# Patient Record
Sex: Female | Born: 1995 | Hispanic: No | Marital: Single | Smoking: Never smoker
Health system: Southern US, Community
[De-identification: ages and names within clinical notes are randomized; demographics above are authoritative.]

## PROBLEM LIST (undated history)

## (undated) DIAGNOSIS — E282 Polycystic ovarian syndrome: Secondary | ICD-10-CM

---

## 2016-10-17 ENCOUNTER — Encounter (INDEPENDENT_AMBULATORY_CARE_PROVIDER_SITE_OTHER): Payer: Self-pay | Admitting: Physician Assistant

## 2016-10-17 ENCOUNTER — Ambulatory Visit (INDEPENDENT_AMBULATORY_CARE_PROVIDER_SITE_OTHER): Payer: Self-pay | Admitting: Physician Assistant

## 2016-10-17 VITALS — BP 144/87 | HR 97 | Temp 98.2°F | Ht 62.21 in | Wt 156.0 lb

## 2016-10-17 DIAGNOSIS — M545 Low back pain, unspecified: Secondary | ICD-10-CM

## 2016-10-17 DIAGNOSIS — G4700 Insomnia, unspecified: Secondary | ICD-10-CM

## 2016-10-17 DIAGNOSIS — Z23 Encounter for immunization: Secondary | ICD-10-CM

## 2016-10-17 DIAGNOSIS — R5383 Other fatigue: Secondary | ICD-10-CM

## 2016-10-17 LAB — POCT URINALYSIS DIPSTICK
BILIRUBIN UA: NEGATIVE
Glucose, UA: NEGATIVE
NITRITE UA: NEGATIVE
PH UA: 6.5 (ref 5.0–8.0)
PROTEIN UA: 30
Spec Grav, UA: 1.025 (ref 1.010–1.025)
Urobilinogen, UA: 1 E.U./dL

## 2016-10-17 LAB — POCT URINE PREGNANCY: Preg Test, Ur: NEGATIVE

## 2016-10-17 MED ORDER — MELATONIN 5 MG PO TABS: ORAL_TABLET | ORAL | 0 refills | Status: DC

## 2016-10-17 NOTE — Patient Instructions (Signed)

## 2016-10-17 NOTE — Progress Notes (Signed)
`   Subjective:  Patient ID: Cheryl Riddle, female    DOB: 04/20/1995  Age: 21 y.o. MRN: [id number]  CC: insomnia  HPI Cheryl Riddle is a 21 y.o. female with no PMH presents with insomnia since approximately 2-3 months ago. Has been using melatonin, benadryl, and night time cough syrups. Difficulty in maintaining sleep. Takes 3mg  melatonin half hour before bedtime. Can not attribute insomnia to anything in particular but sometimes feels stressed with having to provide for herself without help from anyone.    Also complains of left sided LBP x2 days but no pain currently. Pain described as intense and sharp. Felt as if "spine was on fire". Does not have any urinary symptoms, fever, chills, nausea, vomiting.   ROS Review of Systems  Constitutional: Negative for chills, fever and malaise/fatigue.  Eyes: Negative for blurred vision.  Respiratory: Negative for shortness of breath.   Cardiovascular: Negative for chest pain and palpitations.  Gastrointestinal: Negative for abdominal pain and nausea.  Genitourinary: Negative for dysuria and hematuria.  Musculoskeletal: Negative for joint pain and myalgias.  Skin: Negative for rash.  Neurological: Negative for tingling and headaches.  Psychiatric/Behavioral: Negative for depression. The patient is not nervous/anxious.     Objective:  BP (!) 144/87 (BP Location: Left Arm, Patient Position: Sitting, Cuff Size: Large)   Pulse 97   Temp 98.2 F (36.8 C) (Oral)   Ht 5' 2.21" (1.58 m)   Wt 156 lb (70.8 kg)   LMP 09/26/2016 (Approximate)   SpO2 99%   BMI 28.35 kg/m   BP/Weight 10/17/2016  Systolic BP 144  Diastolic BP 87  Wt. (Lbs) 156  BMI 28.35      Physical Exam  Constitutional: She is oriented to person, place, and time.  Well developed, well nourished, NAD, polite  HENT:  Head: Normocephalic and atraumatic.  Eyes: No scleral icterus.  Neck: Normal range of motion. Neck supple. No thyromegaly present.  Cardiovascular: Normal  rate, regular rhythm and normal heart sounds.   Pulmonary/Chest: Effort normal and breath sounds normal.  Abdominal: Soft. Bowel sounds are normal. There is no tenderness.  Musculoskeletal: She exhibits no edema.  Neurological: She is alert and oriented to person, place, and time. No cranial nerve deficit. Coordination normal.  Skin: Skin is warm and dry. No rash noted. No erythema. No pallor.  Psychiatric: She has a normal mood and affect. Her behavior is normal. Thought content normal.  Vitals reviewed.    Assessment & Plan:   1. Insomnia, unspecified type - CBC with Differential - Comprehensive metabolic panel - TSH - Begin Melatonin 5 mg daily; 4 hours before desired bedtime.  2. Fatigue, unspecified type - CBC with Differential - Comprehensive metabolic panel - TSH  3. Low back pain without sciatica, unspecified back pain laterality, unspecified chronicity - CBC with Differential - Comprehensive metabolic panel - TSH - Urinalysis Dipstick with trace RBC and WBC - POCT urine pregnancy negative     Follow-up: 2 weeks.  Loletta Specteroger David Gomez PA

## 2016-10-18 LAB — COMPREHENSIVE METABOLIC PANEL
ALBUMIN: 4.8 g/dL (ref 3.5–5.5)
ALT: 10 IU/L (ref 0–32)
AST: 12 IU/L (ref 0–40)
Albumin/Globulin Ratio: 2.1 (ref 1.2–2.2)
Alkaline Phosphatase: 65 IU/L (ref 39–117)
BILIRUBIN TOTAL: 0.3 mg/dL (ref 0.0–1.2)
BUN / CREAT RATIO: 10 (ref 9–23)
BUN: 8 mg/dL (ref 6–20)
CHLORIDE: 103 mmol/L (ref 96–106)
CO2: 24 mmol/L (ref 20–29)
CREATININE: 0.78 mg/dL (ref 0.57–1.00)
Calcium: 9.4 mg/dL (ref 8.7–10.2)
GFR calc non Af Amer: 109 mL/min/{1.73_m2} (ref >59)
GFR, EST AFRICAN AMERICAN: 126 mL/min/{1.73_m2} (ref >59)
GLUCOSE: 85 mg/dL (ref 65–99)
Globulin, Total: 2.3 g/dL (ref 1.5–4.5)
Potassium: 3.9 mmol/L (ref 3.5–5.2)
Sodium: 141 mmol/L (ref 134–144)
TOTAL PROTEIN: 7.1 g/dL (ref 6.0–8.5)

## 2016-10-18 LAB — CBC WITH DIFFERENTIAL/PLATELET
Basophils Absolute: 0 10*3/uL (ref 0.0–0.2)
Basos: 0 %
EOS (ABSOLUTE): 0.1 10*3/uL (ref 0.0–0.4)
Eos: 1 %
HEMOGLOBIN: 12.6 g/dL (ref 11.1–15.9)
Hematocrit: 38.2 % (ref 34.0–46.6)
IMMATURE GRANS (ABS): 0 10*3/uL (ref 0.0–0.1)
IMMATURE GRANULOCYTES: 0 %
Lymphocytes Absolute: 1.7 10*3/uL (ref 0.7–3.1)
Lymphs: 21 %
MCH: 29.4 pg (ref 26.6–33.0)
MCHC: 33 g/dL (ref 31.5–35.7)
MCV: 89 fL (ref 79–97)
MONOCYTES: 7 %
Monocytes Absolute: 0.6 10*3/uL (ref 0.1–0.9)
NEUTROS PCT: 71 %
Neutrophils Absolute: 6 10*3/uL (ref 1.4–7.0)
PLATELETS: 316 10*3/uL (ref 150–379)
RBC: 4.29 x10E6/uL (ref 3.77–5.28)
RDW: 13.9 % (ref 12.3–15.4)
WBC: 8.4 10*3/uL (ref 3.4–10.8)

## 2016-10-18 LAB — TSH: TSH: 0.723 u[IU]/mL (ref 0.450–4.500)

## 2016-10-31 ENCOUNTER — Encounter (INDEPENDENT_AMBULATORY_CARE_PROVIDER_SITE_OTHER): Payer: Self-pay | Admitting: Physician Assistant

## 2016-10-31 ENCOUNTER — Ambulatory Visit (INDEPENDENT_AMBULATORY_CARE_PROVIDER_SITE_OTHER): Payer: Self-pay | Admitting: Physician Assistant

## 2016-10-31 VITALS — BP 132/82 | HR 66 | Temp 97.9°F | Wt 160.4 lb

## 2016-10-31 DIAGNOSIS — R51 Headache: Secondary | ICD-10-CM

## 2016-10-31 DIAGNOSIS — R519 Headache, unspecified: Secondary | ICD-10-CM

## 2016-10-31 DIAGNOSIS — R102 Pelvic and perineal pain: Secondary | ICD-10-CM

## 2016-10-31 DIAGNOSIS — G4700 Insomnia, unspecified: Secondary | ICD-10-CM

## 2016-10-31 LAB — POCT URINE PREGNANCY: Preg Test, Ur: NEGATIVE

## 2016-10-31 MED ORDER — NAPROXEN 500 MG PO TABS: 500.0000 mg | ORAL_TABLET | Freq: Two times a day (BID) | ORAL | 0 refills | Status: DC

## 2016-10-31 MED ORDER — MIRTAZAPINE 15 MG PO TABS: 15.0000 mg | ORAL_TABLET | Freq: Every day | ORAL | 3 refills | Status: DC

## 2016-10-31 NOTE — Progress Notes (Signed)
Subjective:  Patient ID: Cheryl Riddle, female    DOB: 11/17/1995  Age: 21 y.o. MRN: [id number]  CC: insomnia f/u  HPI Cheryl Riddle is a 21 y.o. female with a medical hx of headaches and insomnia presents of f/u for the same. Tried Melatonin 5 mg 3-4 hours before bedtime and noticed better initiation of sleep but still had difficulty maintaining sleep.     Headaches have decreased in frequency since acquiring new prescription eye wear. Headache occurs once every week as opposed to 3 per week.     Has severe pelvic cramping before and during her menstrual period. Had a normal pelvic and transvaginal US in [place] 3 years ago. Had STD testing and was found to have Chlamydia. Took abx for chlamydia and gonorrhea. Is sexually active but does not feel cramping is anything compared to when she had chlamydia. Does not believe she is pregnant but her menstrual period has not come yet.   Outpatient Medications Prior to Visit  Medication Sig Dispense Refill  . Melatonin 5 MG TABS Take one tablet 4 hours before desired bedtime 30 tablet 0   No facility-administered medications prior to visit.      ROS Review of Systems  Constitutional: Negative for chills, fever and malaise/fatigue.  Eyes: Negative for blurred vision.  Respiratory: Negative for shortness of breath.   Cardiovascular: Negative for chest pain and palpitations.  Gastrointestinal: Negative for abdominal pain and nausea.  Genitourinary: Negative for dysuria and hematuria.       Menstrual cramping.  Musculoskeletal: Negative for joint pain and myalgias.  Skin: Negative for rash.  Neurological: Positive for headaches. Negative for tingling.  Psychiatric/Behavioral: Negative for depression. The patient has insomnia. The patient is not nervous/anxious.     Objective:  BP 132/82 (BP Location: Left Arm, Patient Position: Sitting, Cuff Size: Normal)   Pulse 66   Temp 97.9 F (36.6 C) (Oral)   Wt 160 lb 6.4 oz (72.8 kg)   LMP  10/01/2016 (Exact Date)   SpO2 100%   BMI 29.14 kg/m   BP/Weight 10/31/2016 10/17/2016  Systolic BP 132 144  Diastolic BP 82 87  Wt. (Lbs) 160.4 156  BMI 29.14 28.35      Physical Exam  Constitutional: She is oriented to person, place, and time.  Well developed, well nourished, NAD, polite  HENT:  Head: Normocephalic and atraumatic.  Eyes: No scleral icterus.  Neck: Normal range of motion. Neck supple. No thyromegaly present.  Cardiovascular: Normal rate, regular rhythm and normal heart sounds.   Pulmonary/Chest: Effort normal and breath sounds normal.  Abdominal: Soft. Bowel sounds are normal. There is no tenderness.  Musculoskeletal: She exhibits no edema.  Neurological: She is alert and oriented to person, place, and time.  Skin: Skin is warm and dry. No rash noted. No erythema. No pallor.  Psychiatric: She has a normal mood and affect. Her behavior is normal. Thought content normal.  Vitals reviewed.    Assessment & Plan:     1. Insomnia, unspecified type - Begin mirtazapine (REMERON) 15 MG tablet; Take 1 tablet (15 mg total) by mouth at bedtime.  Dispense: 30 tablet; Refill: 3  2. Nonintractable headache, unspecified chronicity pattern, unspecified headache type - Begin Mirtazapine 15 mg qhs  3. Pelvic cramping - US Pelvis Complete; Future - POCT urine pregnancy - US Transvaginal Non-OB; Future - Begin naproxen (NAPROSYN) 500 MG tablet; Take 1 tablet (500 mg total) by mouth 2 (two) times daily with a meal.  Dispense: 30  tablet; Refill: 0   Meds ordered this encounter  Medications  . mirtazapine (REMERON) 15 MG tablet    Sig: Take 1 tablet (15 mg total) by mouth at bedtime.    Dispense:  30 tablet    Refill:  3    Order Specific Question:   Supervising Provider    Answer:   Quentin Angst [id number]  . naproxen (NAPROSYN) 500 MG tablet    Sig: Take 1 tablet (500 mg total) by mouth 2 (two) times daily with a meal.    Dispense:  30 tablet    Refill:  0     Order Specific Question:   Supervising Provider    Answer:   Quentin Angst [[id number]]    Follow-up: Return in about 8 weeks (around 12/26/2016).   Loletta Specter PA

## 2016-10-31 NOTE — Patient Instructions (Signed)

## 2016-10-31 NOTE — Progress Notes (Signed)
Patient still has the insomnia  States her anxiety levels are down now that her sister has moved in.  Denies back pain   Pt still having headaches but they have gotten a little better since getting her glasses

## 2016-11-07 ENCOUNTER — Ambulatory Visit (HOSPITAL_COMMUNITY): Payer: Self-pay

## 2016-11-26 ENCOUNTER — Ambulatory Visit (HOSPITAL_COMMUNITY)
Admission: RE | Admit: 2016-11-26 | Discharge: 2016-11-26 | Disposition: A | Discharge status: Home / Self Care | Payer: Self-pay | Source: Ambulatory Visit | Attending: Physician Assistant | Admitting: Physician Assistant

## 2016-11-26 DIAGNOSIS — R102 Pelvic and perineal pain: Secondary | ICD-10-CM | POA: Insufficient documentation

## 2016-11-29 ENCOUNTER — Telehealth (INDEPENDENT_AMBULATORY_CARE_PROVIDER_SITE_OTHER): Payer: Self-pay

## 2016-11-29 NOTE — Telephone Encounter (Signed)
Patient verified DOB Patient is aware of trace of fluid being noted next to the right ovary. Patient is aware of monitor for severe pain or bleeding but that the finding is not worrisome and the fluid should reabsorb into the body. Patient has an appointment for 12/23/16 and will follow up accordingly.

## 2016-11-29 NOTE — Telephone Encounter (Signed)
-----   Message from Loletta Specteroger David Gomez, PA-C sent at 11/27/2016  8:35 AM EDT ----- Trace fluid adjacent to right ovary, not worrisome, may have been a ruptured cyst. Otherwise no findings.

## 2016-12-23 ENCOUNTER — Ambulatory Visit (INDEPENDENT_AMBULATORY_CARE_PROVIDER_SITE_OTHER): Payer: Self-pay | Admitting: Physician Assistant

## 2016-12-23 ENCOUNTER — Encounter (INDEPENDENT_AMBULATORY_CARE_PROVIDER_SITE_OTHER): Payer: Self-pay | Admitting: Physician Assistant

## 2016-12-23 VITALS — BP 122/79 | HR 100 | Temp 98.0°F | Wt 163.0 lb

## 2016-12-23 DIAGNOSIS — F411 Generalized anxiety disorder: Secondary | ICD-10-CM

## 2016-12-23 DIAGNOSIS — R51 Headache: Secondary | ICD-10-CM

## 2016-12-23 DIAGNOSIS — R519 Headache, unspecified: Secondary | ICD-10-CM

## 2016-12-23 DIAGNOSIS — N912 Amenorrhea, unspecified: Secondary | ICD-10-CM

## 2016-12-23 LAB — POCT URINE PREGNANCY: PREG TEST UR: NEGATIVE

## 2016-12-23 MED ORDER — ESCITALOPRAM OXALATE 10 MG PO TABS: 10.0000 mg | ORAL_TABLET | Freq: Every day | ORAL | 1 refills | Status: DC

## 2016-12-23 MED ORDER — TOPIRAMATE 25 MG PO TABS: 25.0000 mg | ORAL_TABLET | Freq: Two times a day (BID) | ORAL | 2 refills | Status: DC

## 2016-12-23 NOTE — Patient Instructions (Signed)
Please   Generalized Anxiety Disorder, Adult Generalized anxiety disorder (GAD) is a mental health disorder. People with this condition constantly worry about everyday events. Unlike normal anxiety, worry related to GAD is not triggered by a specific event. These worries also do not fade or get better with time. GAD interferes with life functions, including relationships, work, and school. GAD can vary from mild to severe. People with severe GAD can have intense waves of anxiety with physical symptoms (panic attacks). What are the causes? The exact cause of GAD is not known. What increases the risk? This condition is more likely to develop in:  Women.  People who have a family history of anxiety disorders.  People who are very shy.  People who experience very stressful life events, such as the death of a loved one.  People who have a very stressful family environment.  What are the signs or symptoms? People with GAD often worry excessively about many things in their lives, such as their health and family. They may also be overly concerned about:  Doing well at work.  Being on time.  Natural disasters.  Friendships.  Physical symptoms of GAD include:  Fatigue.  Muscle tension or having muscle twitches.  Trembling or feeling shaky.  Being easily startled.  Feeling like your heart is pounding or racing.  Feeling out of breath or like you cannot take a deep breath.  Having trouble falling asleep or staying asleep.  Sweating.  Nausea, diarrhea, or irritable bowel syndrome (IBS).  Headaches.  Trouble concentrating or remembering facts.  Restlessness.  Irritability.  How is this diagnosed? Your health care provider can diagnose GAD based on your symptoms and medical history. You will also have a physical exam. The health care provider will ask specific questions about your symptoms, including how severe they are, when they started, and if they come and go. Your  health care provider may ask you about your use of alcohol or drugs, including prescription medicines. Your health care provider may refer you to a mental health specialist for further evaluation. Your health care provider will do a thorough examination and may perform additional tests to rule out other possible causes of your symptoms. To be diagnosed with GAD, a person must have anxiety that:  Is out of his or her control.  Affects several different aspects of his or her life, such as work and relationships.  Causes distress that makes him or her unable to take part in normal activities.  Includes at least three physical symptoms of GAD, such as restlessness, fatigue, trouble concentrating, irritability, muscle tension, or sleep problems.  Before your health care provider can confirm a diagnosis of GAD, these symptoms must be present more days than they are not, and they must last for six months or longer. How is this treated? The following therapies are usually used to treat GAD:  Medicine. Antidepressant medicine is usually prescribed for long-term daily control. Antianxiety medicines may be added in severe cases, especially when panic attacks occur.  Talk therapy (psychotherapy). Certain types of talk therapy can be helpful in treating GAD by providing support, education, and guidance. Options include: ? Cognitive behavioral therapy (CBT). People learn coping skills and techniques to ease their anxiety. They learn to identify unrealistic or negative thoughts and behaviors and to replace them with positive ones. ? Acceptance and commitment therapy (ACT). This treatment teaches people how to be mindful as a way to cope with unwanted thoughts and feelings. ? Biofeedback. This process  trains you to manage your body's response (physiological response) through breathing techniques and relaxation methods. You will work with a therapist while machines are used to monitor your physical  symptoms.  Stress management techniques. These include yoga, meditation, and exercise.  A mental health specialist can help determine which treatment is best for you. Some people see improvement with one type of therapy. However, other people require a combination of therapies. Follow these instructions at home:  Take over-the-counter and prescription medicines only as told by your health care provider.  Try to maintain a normal routine.  Try to anticipate stressful situations and allow extra time to manage them.  Practice any stress management or self-calming techniques as taught by your health care provider.  Do not punish yourself for setbacks or for not making progress.  Try to recognize your accomplishments, even if they are small.  Keep all follow-up visits as told by your health care provider. This is important. Contact a health care provider if:  Your symptoms do not get better.  Your symptoms get worse.  You have signs of depression, such as: ? A persistently sad, cranky, or irritable mood. ? Loss of enjoyment in activities that used to bring you joy. ? Change in weight or eating. ? Changes in sleeping habits. ? Avoiding friends or family members. ? Loss of energy for normal tasks. ? Feelings of guilt or worthlessness. Get help right away if:  You have serious thoughts about hurting yourself or others. If you ever feel like you may hurt yourself or others, or have thoughts about taking your own life, get help right away. You can go to your nearest emergency department or call:  Your local emergency services (911 in the [place]  A suicide crisis helpline, such as the National Suicide Prevention Lifeline at 236-831-6528. This is open 24 hours a day.  Summary  Generalized anxiety disorder (GAD) is a mental health disorder that involves worry that is not triggered by a specific event.  People with GAD often worry excessively about many things in their lives, such  as their health and family.  GAD may cause physical symptoms such as restlessness, trouble concentrating, sleep problems, frequent sweating, nausea, diarrhea, headaches, and trembling or muscle twitching.  A mental health specialist can help determine which treatment is best for you. Some people see improvement with one type of therapy. However, other people require a combination of therapies. This information is not intended to replace advice given to you by your health care provider. Make sure you discuss any questions you have with your health care provider. Document Released: 07/06/2012 Document Revised: 01/30/2016 Document Reviewed: 01/30/2016 Elsevier Interactive Patient Education  [employer].

## 2016-12-23 NOTE — Progress Notes (Signed)
Subjective:  Patient ID: Cheryl Riddle, female    DOB: Apr 09, 1995  Age: 21 y.o. MRN: [id number]  CC: insomnia  HPI  Cheryl Riddle is a 21 y.o. female with a medical hx of headaches and insomnia presents of f/u for insomnia. Says she used Mirtazapine 15 mg worked for the first two weeks with sustained sleep and reduction in headaches. She stopped Mirtazapine at week three because she developed headaches again. Took naproxen with some relief of headache. Does not want to take pills for her headache if she can help it.    Also complains of amenorrhea for the past 3 months.  Last depo shot 3-4 years ago. Recent TSH and US pelvic/transvaginal were normal. She is sexually active.    Outpatient Medications Prior to Visit  Medication Sig Dispense Refill  . Melatonin 5 MG TABS Take one tablet 4 hours before desired bedtime (Patient not taking: Reported on 12/23/2016) 30 tablet 0  . mirtazapine (REMERON) 15 MG tablet Take 1 tablet (15 mg total) by mouth at bedtime. (Patient not taking: Reported on 12/23/2016) 30 tablet 3  . naproxen (NAPROSYN) 500 MG tablet Take 1 tablet (500 mg total) by mouth 2 (two) times daily with a meal. (Patient not taking: Reported on 12/23/2016) 30 tablet 0   No facility-administered medications prior to visit.      ROS Review of Systems  Constitutional: Negative for chills, fever and malaise/fatigue.  Eyes: Negative for blurred vision.  Respiratory: Negative for shortness of breath.   Cardiovascular: Negative for chest pain and palpitations.  Gastrointestinal: Negative for abdominal pain and nausea.  Genitourinary: Negative for dysuria and hematuria.  Musculoskeletal: Negative for joint pain and myalgias.  Skin: Negative for rash.  Neurological: Negative for tingling and headaches.  Psychiatric/Behavioral: Negative for depression. The patient is nervous/anxious.     Objective:  BP 122/79 (BP Location: Left Arm, Patient Position: Sitting, Cuff Size: Normal)    Pulse 100   Temp 98 F (36.7 C) (Oral)   Wt 163 lb (73.9 kg)   LMP 10/03/2016 (Approximate)   SpO2 100%   BMI 29.62 kg/m   BP/Weight 12/23/2016 10/31/2016 10/17/2016  Systolic BP 122 132 144  Diastolic BP 79 82 87  Wt. (Lbs) 163 160.4 156  BMI 29.62 29.14 28.35      Physical Exam  Constitutional: She is oriented to person, place, and time.  Well developed, well nourished, NAD, polite  HENT:  Head: Normocephalic and atraumatic.  Eyes: No scleral icterus.  Neck: Normal range of motion. Neck supple. No thyromegaly present.  Cardiovascular: Normal rate, regular rhythm and normal heart sounds.   Pulmonary/Chest: Effort normal.  Musculoskeletal: She exhibits no edema.  Neurological: She is alert and oriented to person, place, and time. No cranial nerve deficit. Coordination normal.  Skin: Skin is warm and dry. No rash noted. No erythema. No pallor.  Psychiatric: Her behavior is normal. Thought content normal.  Somewhat anxious.  Vitals reviewed.    Assessment & Plan:   1. Amenorrhea - POCT urine pregnancy negative in clinic today. Recent TSH and US pelvic/transvaginal normal.  2. Generalized anxiety disorder - GAD 7 score 13.  - Begin escitalopram (LEXAPRO) 10 MG tablet; Take 1 tablet (10 mg total) by mouth daily.  Dispense: 30 tablet; Refill: 1 - Sent message to Jenel Lucks, Clinical Social Worker, to reach out to patient.  3. Nonintractable headache, unspecified chronicity pattern, unspecified headache type - Begin topiramate (TOPAMAX) 25 MG tablet; Take 1 tablet (25 mg total)  by mouth 2 (two) times daily.  Dispense: 30 tablet; Refill: 2   Meds ordered this encounter  Medications  . escitalopram (LEXAPRO) 10 MG tablet    Sig: Take 1 tablet (10 mg total) by mouth daily.    Dispense:  30 tablet    Refill:  1    Order Specific Question:   Supervising Provider    Answer:   Quentin Angst [id number]  . topiramate (TOPAMAX) 25 MG tablet    Sig: Take 1 tablet (25 mg  total) by mouth 2 (two) times daily.    Dispense:  30 tablet    Refill:  2    Order Specific Question:   Supervising Provider    Answer:   Quentin Angst [id number]    Follow-up: Return in about 4 weeks (around 01/20/2017) for Anxiety.   Loletta Specter PA

## 2017-01-01 ENCOUNTER — Telehealth: Payer: Self-pay | Admitting: Licensed Clinical Social Worker

## 2017-01-01 NOTE — Telephone Encounter (Signed)
LCSWA placed call to patient to follow up on behavioral screens. A HIPPA compliant message was left requesting a return call.

## 2017-01-03 ENCOUNTER — Telehealth: Payer: Self-pay | Admitting: Licensed Clinical Social Worker

## 2017-01-03 NOTE — Telephone Encounter (Signed)
LCSWA placed call to patient to follow up on behavioral screens. A HIPPA compliant message was left requesting a return call.  

## 2017-02-05 ENCOUNTER — Other Ambulatory Visit: Payer: Self-pay

## 2017-02-05 ENCOUNTER — Ambulatory Visit (INDEPENDENT_AMBULATORY_CARE_PROVIDER_SITE_OTHER): Payer: Self-pay | Admitting: Physician Assistant

## 2017-02-05 ENCOUNTER — Encounter (INDEPENDENT_AMBULATORY_CARE_PROVIDER_SITE_OTHER): Payer: Self-pay | Admitting: Physician Assistant

## 2017-02-05 VITALS — BP 124/89 | HR 61 | Temp 97.5°F | Wt 165.4 lb

## 2017-02-05 DIAGNOSIS — G4700 Insomnia, unspecified: Secondary | ICD-10-CM

## 2017-02-05 DIAGNOSIS — Z566 Other physical and mental strain related to work: Secondary | ICD-10-CM

## 2017-02-05 DIAGNOSIS — Z30016 Encounter for initial prescription of transdermal patch hormonal contraceptive device: Secondary | ICD-10-CM

## 2017-02-05 LAB — POCT URINE PREGNANCY: Preg Test, Ur: NEGATIVE

## 2017-02-05 MED ORDER — NORELGESTROMIN-ETH ESTRADIOL 150-35 MCG/24HR TD PTWK: 1.0000 | MEDICATED_PATCH | TRANSDERMAL | 12 refills | Status: DC

## 2017-02-05 NOTE — Progress Notes (Signed)
   Subjective:  Patient ID: Cheryl Riddle, female    DOB: 06/23/1995  Age: 21 y.o. MRN: [id number]  CC: contraception  HPI  Cheryl PeerLindsey Turneris a 21 y.o.femalewith a medical hx of headaches and insomnia presents for advise on contraception. Interested in transdermal patch as she has used it before and had better adherence as opposed to daily OCP.  Has amenorrhea for the past 4 months.  Last depo shot 3-4 years ago. Recent TSH and US pelvic/transvaginal were normal. Has not been sexually active since one month ago. Liver function normal 4 months ago, no history of coagulopathies, no smoking history, no breast cancer history.     Reports having stopped Topiramate and Escitalopram because other people were telling her that she is becoming more irritable.    Outpatient Medications Prior to Visit  Medication Sig Dispense Refill  . escitalopram (LEXAPRO) 10 MG tablet Take 1 tablet (10 mg total) by mouth daily. (Patient not taking: Reported on 02/05/2017) 30 tablet 1  . topiramate (TOPAMAX) 25 MG tablet Take 1 tablet (25 mg total) by mouth 2 (two) times daily. (Patient not taking: Reported on 02/05/2017) 30 tablet 2   No facility-administered medications prior to visit.      ROS Review of Systems  Constitutional: Negative for chills, fever and malaise/fatigue.  Eyes: Negative for blurred vision.  Respiratory: Negative for shortness of breath.   Cardiovascular: Negative for chest pain and palpitations.  Gastrointestinal: Negative for abdominal pain and nausea.  Genitourinary: Negative for dysuria and hematuria.       Amenorrhea  Musculoskeletal: Negative for joint pain and myalgias.  Skin: Negative for rash.  Neurological: Negative for tingling and headaches.  Psychiatric/Behavioral: Negative for depression. The patient has insomnia. The patient is not nervous/anxious.        Stress    Objective:  Blood pressure 124/89, pulse 61, temperature (!) 97.5 F (36.4 C), temperature source  Oral, weight 165 lb 6.4 oz (75 kg), last menstrual period 10/01/2016, SpO2 100 %.   Physical Exam  Constitutional: She is oriented to person, place, and time.  Well developed, well nourished, NAD, polite  HENT:  Head: Normocephalic and atraumatic.  Eyes: No scleral icterus.  Neck: Normal range of motion. Neck supple. No thyromegaly present.  Cardiovascular: Normal rate, regular rhythm and normal heart sounds.  Pulmonary/Chest: Effort normal.  Musculoskeletal: She exhibits no edema.  Neurological: She is alert and oriented to person, place, and time. No cranial nerve deficit. Coordination normal.  Skin: Skin is warm and dry. No rash noted. No erythema. No pallor.  Psychiatric: She has a normal mood and affect. Her behavior is normal. Thought content normal.  Vitals reviewed.    Assessment & Plan:    1. Encounter for initial prescription of transdermal patch hormonal contraceptive device - Begin Ortho Evra  - Stop Topiramate  2. Stress at work - Take escitalopram 10 mg as directed. 6-8 weeks are needed for escitalopram to be at maximum effect at this dose.  3. Insomnia - Take escitalopram 10 mg as directed. 6-8 weeks are needed for escitalopram to be at maximum effect at this dose.  Follow-up: 8 weeks for stress and insomnia.   Loletta Specteroger David Jesselle Laflamme PA

## 2017-02-05 NOTE — Patient Instructions (Addendum)
Stop taking Topiramate when on birth control patch regimen.    Ethinyl Estradiol; Norelgestromin skin patches What is this medicine? ETHINYL ESTRADIOL;NORELGESTROMIN (ETH in il es tra DYE ole; nor el JES troe min) skin patch is used as a contraceptive (birth control method). This medicine combines two types of female hormones, an estrogen and a progestin. This patch is used to prevent ovulation and pregnancy. This medicine may be used for other purposes; ask your health care provider or pharmacist if you have questions. COMMON BRAND NAME(S): Ortho Christianne Borrow What should I tell my health care provider before I take this medicine? They need to know if you have or ever had any of these conditions: -abnormal vaginal bleeding -blood vessel disease or blood clots -breast, cervical, endometrial, ovarian, liver, or uterine cancer -diabetes -gallbladder disease -heart disease or recent heart attack -high blood pressure -high cholesterol -kidney disease -liver disease -migraine headaches -stroke -systemic lupus erythematosus (SLE) -tobacco smoker -an unusual or allergic reaction to estrogens, progestins, other medicines, foods, dyes, or preservatives -pregnant or trying to get pregnant -breast-feeding How should I use this medicine? This patch is applied to the skin. Follow the directions on the prescription label. Apply to clean, dry, healthy skin on the buttock, abdomen, upper outer arm or upper torso, in a place where it will not be rubbed by tight clothing. Do not use lotions or other cosmetics on the site where the patch will go. Press the patch firmly in place for 10 seconds to ensure good contact with the skin. Change the patch every 7 days on the same day of the week for 3 weeks. You will then have a break from the patch for 1 week, after which you will apply a new patch. Do not use your medicine more often than directed. Contact your pediatrician regarding the use of this medicine in  children. Special care may be needed. This medicine has been used in female children who have started having menstrual periods. A patient package insert for the product will be given with each prescription and refill. Read this sheet carefully each time. The sheet may change frequently. Overdosage: If you think you have taken too much of this medicine contact a poison control center or emergency room at once. NOTE: This medicine is only for you. Do not share this medicine with others. What if I miss a dose? You will need to replace your patch once a week as directed. If your patch is lost or falls off, contact your health care professional for advice. You may need to use another form of birth control if your patch has been off for more than 1 day. What may interact with this medicine? Do not take this medicine with the following medication: -dasabuvir; ombitasvir; paritaprevir; ritonavir -ombitasvir; paritaprevir; ritonavir This medicine may also interact with the following medications: -acetaminophen -antibiotics or medicines for infections, especially rifampin, rifabutin, rifapentine, and griseofulvin, and possibly penicillins or tetracyclines -aprepitant -ascorbic acid (vitamin C) -atorvastatin -barbiturate medicines, such as phenobarbital -bosentan -carbamazepine -caffeine -clofibrate -cyclosporine -dantrolene -doxercalciferol -felbamate -grapefruit juice -hydrocortisone -medicines for anxiety or sleeping problems, such as diazepam or temazepam -medicines for diabetes, including pioglitazone -modafinil -mycophenolate -nefazodone -oxcarbazepine -phenytoin -prednisolone -ritonavir or other medicines for HIV infection or AIDS -rosuvastatin -selegiline -soy isoflavones supplements -St. John's wort -tamoxifen or raloxifene -theophylline -thyroid hormones -topiramate -warfarin This list may not describe all possible interactions. Give your health care provider a list of all  the medicines, herbs, non-prescription drugs, or dietary supplements you use.  Also tell them if you smoke, drink alcohol, or use illegal drugs. Some items may interact with your medicine. What should I watch for while using this medicine? Visit your doctor or health care professional for regular checks on your progress. You will need a regular breast and pelvic exam and Pap smear while on this medicine. Use an additional method of contraception during the first cycle that you use this patch. If you have any reason to think you are pregnant, stop using this medicine right away and contact your doctor or health care professional. If you are using this medicine for hormone related problems, it may take several cycles of use to see improvement in your condition. Smoking increases the risk of getting a blood clot or having a stroke while you are using hormonal birth control, especially if you are more than 22 years old. You are strongly advised not to smoke. This medicine can make your body retain fluid, making your fingers, hands, or ankles swell. Your blood pressure can go up. Contact your doctor or health care professional if you feel you are retaining fluid. This medicine can make you more sensitive to the sun. Keep out of the sun. If you cannot avoid being in the sun, wear protective clothing and use sunscreen. Do not use sun lamps or tanning beds/booths. If you wear contact lenses and notice visual changes, or if the lenses begin to feel uncomfortable, consult your eye care specialist. In some women, tenderness, swelling, or minor bleeding of the gums may occur. Notify your dentist if this happens. Brushing and flossing your teeth regularly may help limit this. See your dentist regularly and inform your dentist of the medicines you are taking. If you are going to have elective surgery or a MRI, you may need to stop using this medicine before the surgery or MRI. Consult your health care professional for  advice. This medicine does not protect you against HIV infection (AIDS) or any other sexually transmitted diseases. What side effects may I notice from receiving this medicine? Side effects that you should report to your doctor or health care professional as soon as possible: -breast tissue changes or discharge -changes in vaginal bleeding during your period or between your periods -chest pain -coughing up blood -dizziness or fainting spells -headaches or migraines -leg, arm or groin pain -severe or sudden headaches -stomach pain (severe) -sudden shortness of breath -sudden loss of coordination, especially on one side of the body -speech problems -symptoms of vaginal infection like itching, irritation or unusual discharge -tenderness in the upper abdomen -vomiting -weakness or numbness in the arms or legs, especially on one side of the body -yellowing of the eyes or skin Side effects that usually do not require medical attention (report to your doctor or health care professional if they continue or are bothersome): -breakthrough bleeding and spotting that continues beyond the 3 initial cycles of pills -breast tenderness -mood changes, anxiety, depression, frustration, anger, or emotional outbursts -increased sensitivity to sun or ultraviolet light -nausea -skin rash, acne, or brown spots on the skin -weight gain (slight) This list may not describe all possible side effects. Call your doctor for medical advice about side effects. You may report side effects to FDA at 1-800-FDA-1088. Where should I keep my medicine? Keep out of the reach of children. Store at room temperature between 15 and 30 degrees C (59 and 86 degrees F). Keep the patch in its pouch until time of use. Throw away any unused medicine after the  expiration date. Dispose of used patches properly. Since a used patch may still contain active hormones, fold the patch in half so that it sticks to itself prior to disposal.  Throw away in a place where children or pets cannot reach. NOTE: This sheet is a summary. It may not cover all possible information. If you have questions about this medicine, talk to your doctor, pharmacist, or health care provider.  2018 [employer]vier/Gold Standard (2015-11-20 07:59:03)

## 2017-09-04 IMAGING — US US PELVIS COMPLETE
1 series · 14 of 25 positions shown · non-contrast
Comparison: None

CLINICAL DATA: Pelvic cramping for 2-3 years

EXAM:
TRANSABDOMINAL AND TRANSVAGINAL ULTRASOUND OF PELVIS
TECHNIQUE: Both transabdominal and transvaginal ultrasound examinations of the
pelvis were performed. Transabdominal technique was performed for
global imaging of the pelvis including uterus, ovaries, adnexal
regions, and pelvic cul-de-sac. It was necessary to proceed with
endovaginal exam following the transabdominal exam to visualize the
uterus endometrium and ovaries.

[Series 1: us pelvis complete · 0.18mm/px · 14 of 87 slices shown]
[im 1/87]
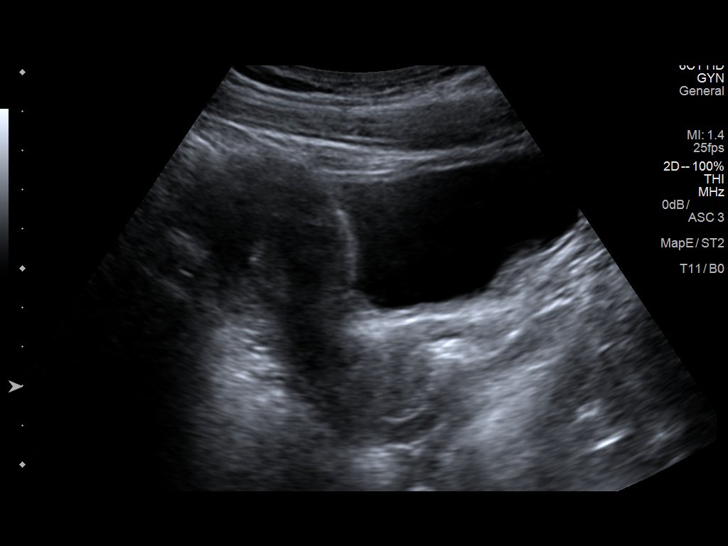
[im 8/87]
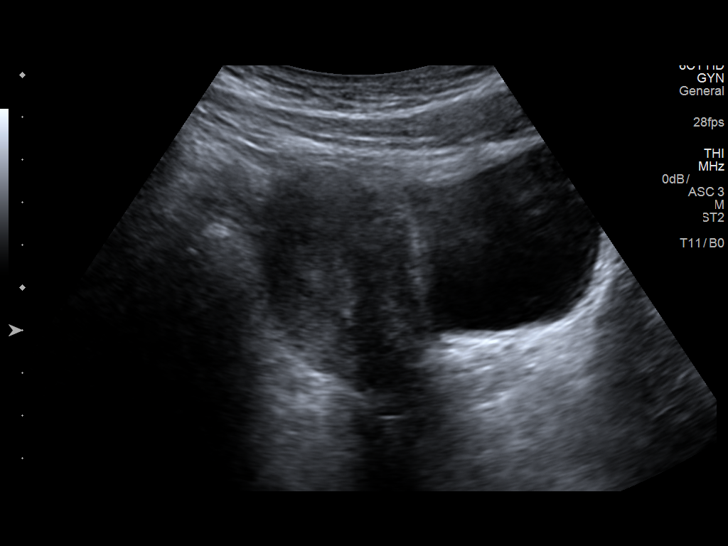
[im 15/87]
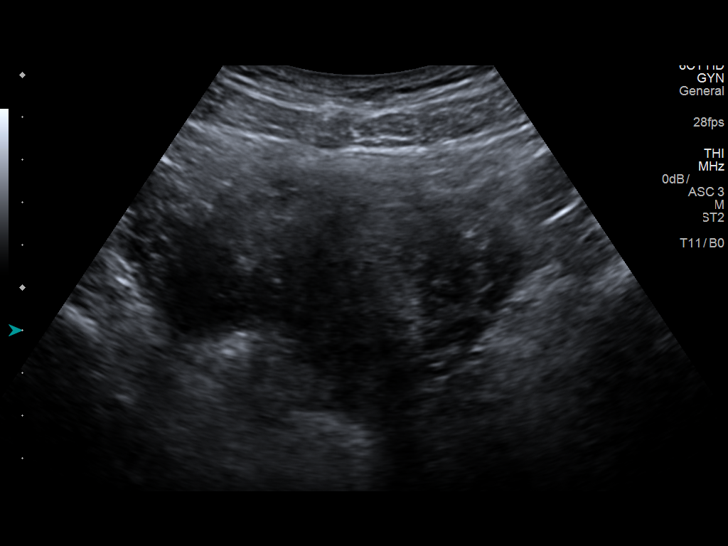
[im 22/87]
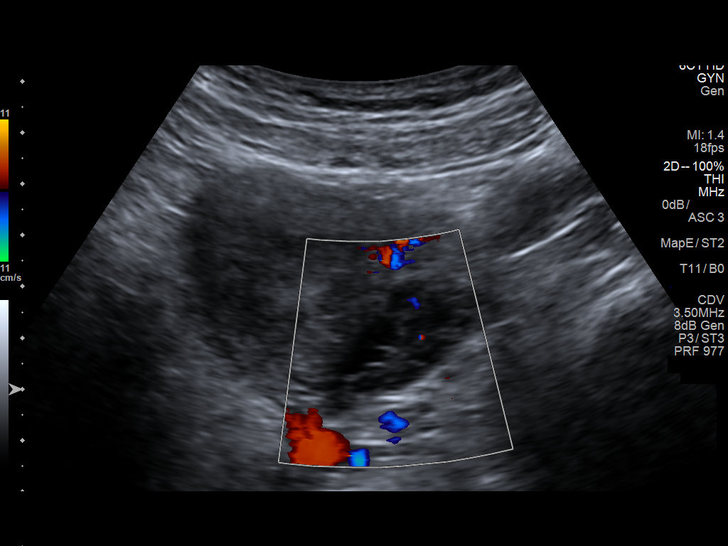
[im 29/87]
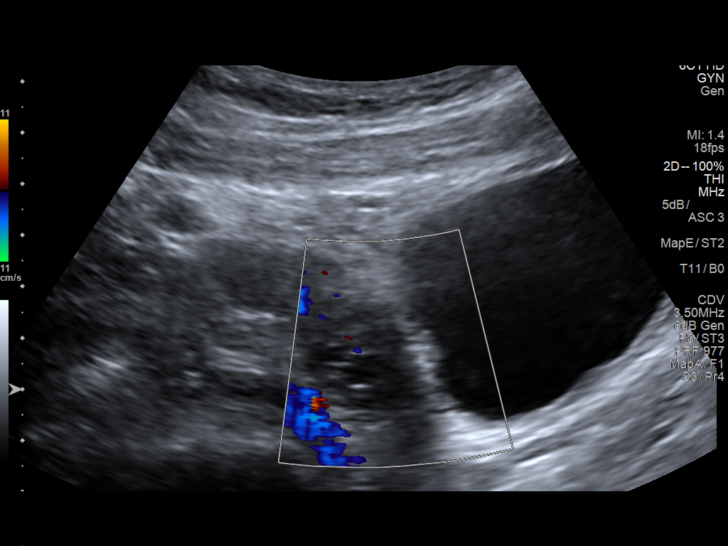
[im 33/87]
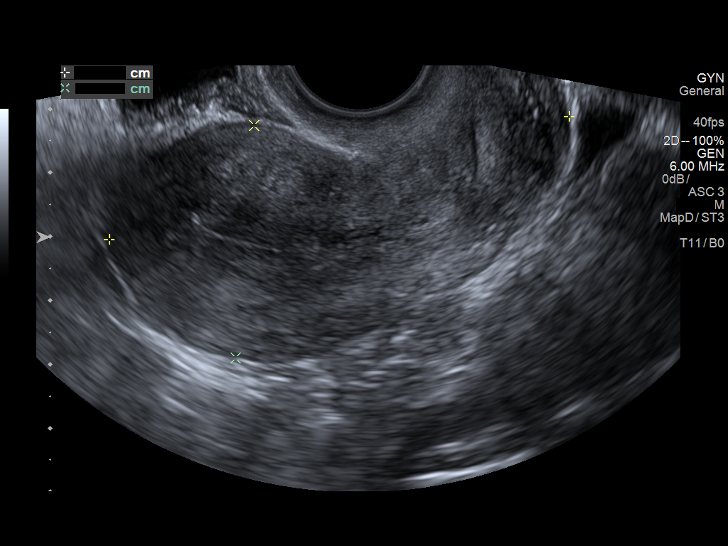
[im 40/87]
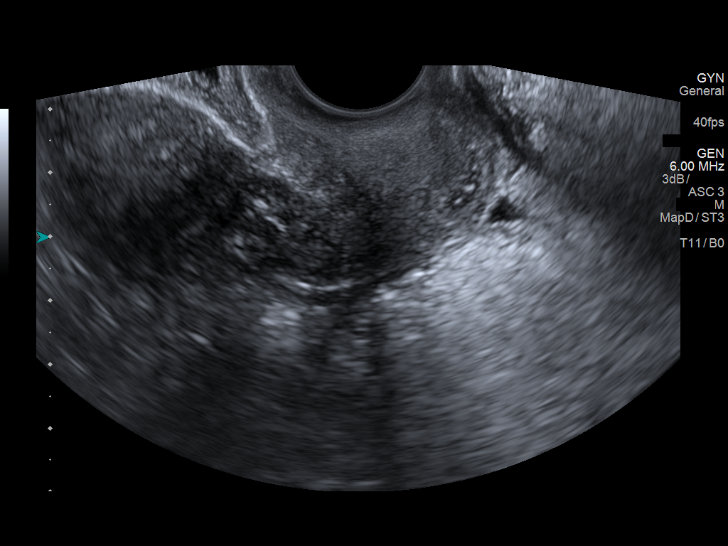
[im 47/87]
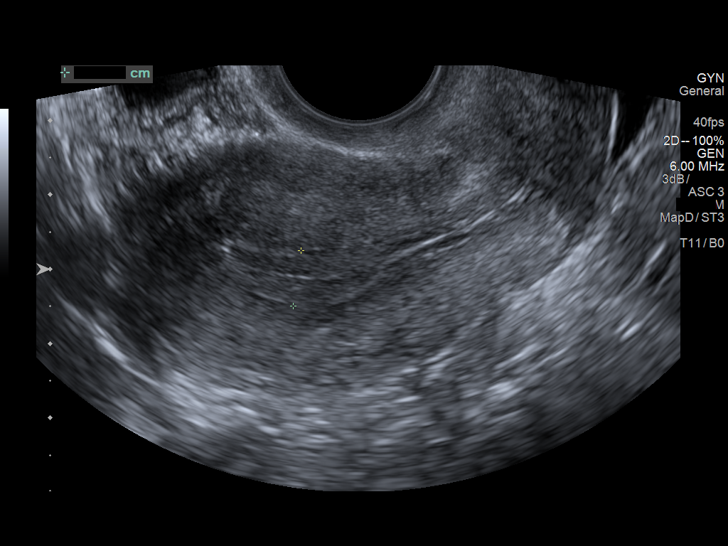
[im 54/87]
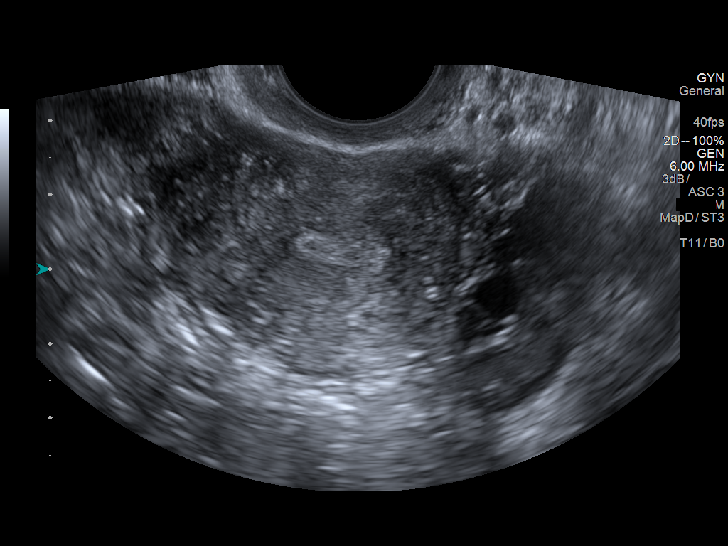
[im 58/87]
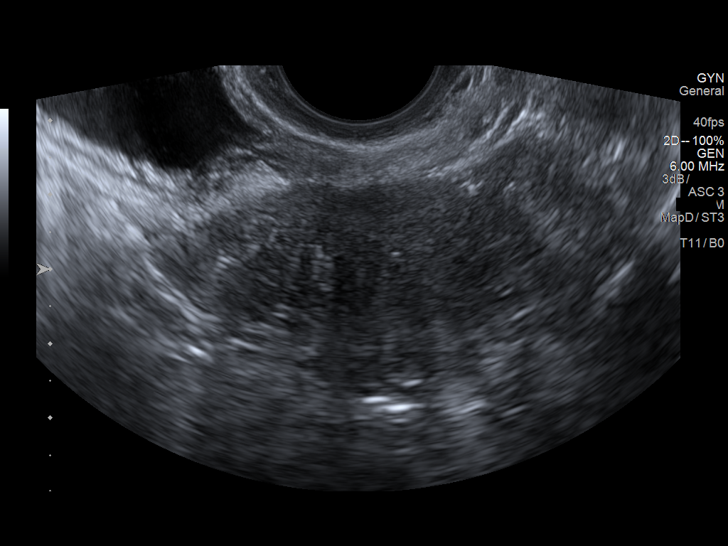
[im 65/87]
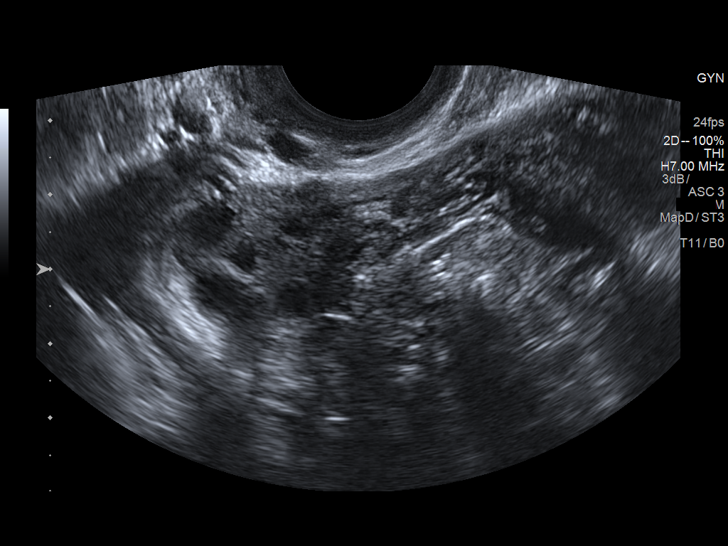
[im 72/87]
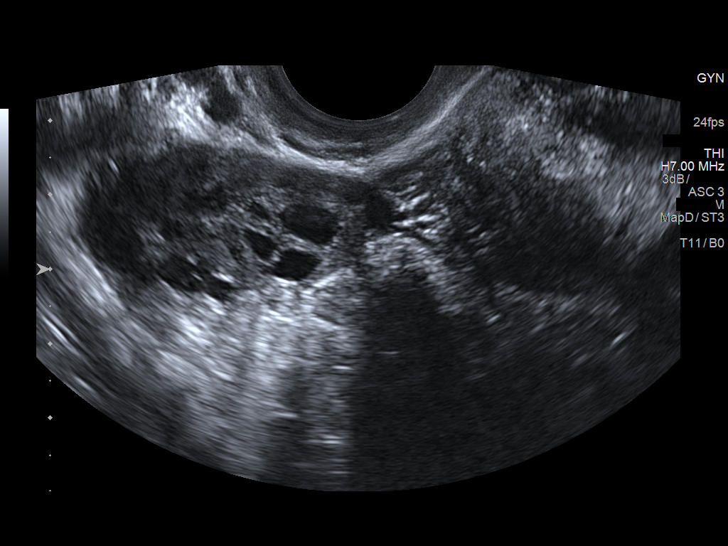
[im 79/87]
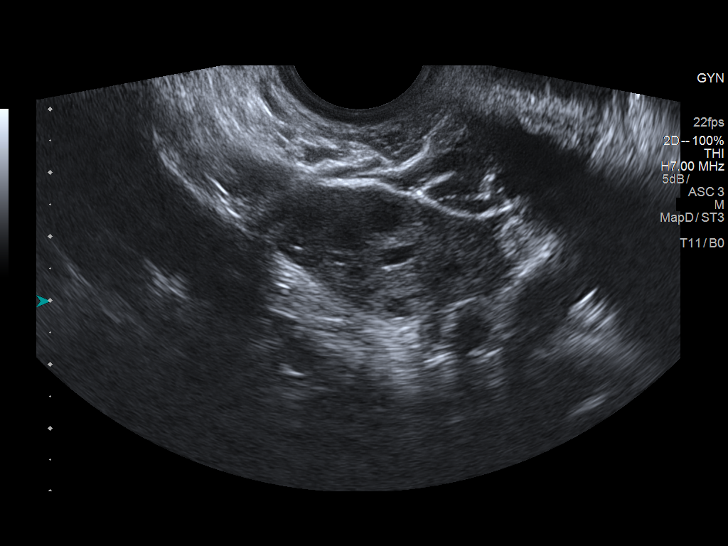
[im 87/87]
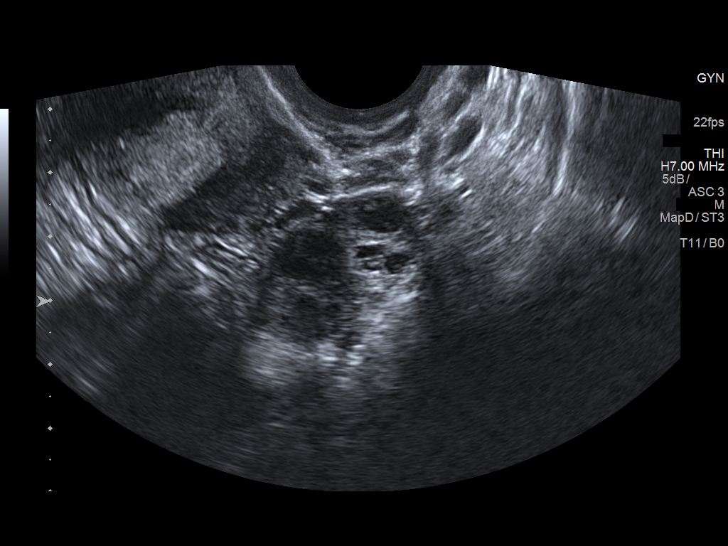

[14 of 25 positions shown; findings below may reference images not displayed]

FINDINGS: Uterus

Measurements: 7.5 x 3.7 x 4.8 cm. No fibroids or other mass
visualized.

Endometrium

Thickness: 7.5 mm.  No focal abnormality visualized.

Right ovary

Measurements: 4 x 2 x 3.3 cm. Normal appearance/no adnexal mass.

Left ovary

Measurements: 5.1 x 2.1 x 2.3 cm. Trace free fluid adjacent to the
right ovary

Other findings

No abnormal free fluid.
IMPRESSION: 1. Trace free fluid adjacent to right ovary
2. Otherwise negative pelvic ultrasound

## 2017-11-26 ENCOUNTER — Other Ambulatory Visit: Payer: Self-pay

## 2017-11-26 ENCOUNTER — Emergency Department (HOSPITAL_COMMUNITY)
Admission: EM | Admit: 2017-11-26 | Discharge: 2017-11-27 | Disposition: A | Discharge status: Home / Self Care | Payer: 59 | Attending: Emergency Medicine | Admitting: Emergency Medicine

## 2017-11-26 ENCOUNTER — Encounter (HOSPITAL_COMMUNITY): Payer: Self-pay

## 2017-11-26 DIAGNOSIS — R300 Dysuria: Secondary | ICD-10-CM | POA: Diagnosis present

## 2017-11-26 DIAGNOSIS — Z79899 Other long term (current) drug therapy: Secondary | ICD-10-CM | POA: Insufficient documentation

## 2017-11-26 DIAGNOSIS — N3000 Acute cystitis without hematuria: Secondary | ICD-10-CM | POA: Diagnosis not present

## 2017-11-26 LAB — URINALYSIS, ROUTINE W REFLEX MICROSCOPIC
Bilirubin Urine: NEGATIVE
Glucose, UA: NEGATIVE mg/dL
Hgb urine dipstick: NEGATIVE
Ketones, ur: NEGATIVE mg/dL
NITRITE: NEGATIVE
PROTEIN: NEGATIVE mg/dL
Specific Gravity, Urine: 1.029 (ref 1.005–1.030)
pH: 5 (ref 5.0–8.0)

## 2017-11-26 LAB — PREGNANCY, URINE: PREG TEST UR: NEGATIVE

## 2017-11-26 MED ORDER — CEPHALEXIN 500 MG PO CAPS: 1000.0000 mg | ORAL_CAPSULE | Freq: Two times a day (BID) | ORAL | 0 refills | Status: AC

## 2017-11-26 MED ORDER — CEPHALEXIN 250 MG PO CAPS
1000.0000 mg | ORAL_CAPSULE | Freq: Once | ORAL | Status: AC
  Administered 2017-11-27: 1000 mg via ORAL
  Filled 2017-11-26: qty 4

## 2017-11-26 MED ORDER — PHENAZOPYRIDINE HCL 200 MG PO TABS: 200.0000 mg | ORAL_TABLET | Freq: Three times a day (TID) | ORAL | 0 refills | Status: DC | PRN

## 2017-11-26 NOTE — ED Provider Notes (Signed)
[facility] EMERGENCY DEPARTMENT Provider Note   CSN: [id number] Arrival date & time: 11/26/17  2211     History   Chief Complaint Chief Complaint  Patient presents with  . Urinary Tract Infection    HPI Jaynia Fendley is a 22 y.o. female.  Patient presents with complaint of discomfort associated with urination that started today. No fever, nausea, vomiting, or flank pain. She has had urinary tract infections in the past and feels this is similar. She denies vaginal discharge, pelvic pain, unusual bleeding.   The history is provided by the patient. No language interpreter was used.    History reviewed. No pertinent past medical history.  Patient Active Problem List   Diagnosis Date Noted  . Encounter for initial prescription of transdermal patch hormonal contraceptive device 02/05/2017    History reviewed. No pertinent surgical history.   OB History   None      Home Medications    Prior to Admission medications   Medication Sig Start Date End Date Taking? Authorizing Provider  escitalopram (LEXAPRO) 10 MG tablet Take 1 tablet (10 mg total) by mouth daily. Patient not taking: Reported on 02/05/2017 12/23/16   Loletta Specter, PA-C  norelgestromin-ethinyl estradiol (ORTHO EVRA) 150-35 MCG/24HR transdermal patch Place 1 patch once a week onto the skin. 02/05/17   Loletta Specter, PA-C    Family History No family history on file.  Social History Social History   Tobacco Use  . Smoking status: Never Smoker  . Smokeless tobacco: Never Used  Substance Use Topics  . Alcohol use: Never    Frequency: Never  . Drug use: Never     Allergies   Sulfa antibiotics and Sulfur   Review of Systems Review of Systems  Constitutional: Negative for chills and fever.  Gastrointestinal: Negative.  Negative for abdominal pain and nausea.  Genitourinary: Positive for dysuria. Negative for difficulty urinating, flank pain, frequency, pelvic pain,  vaginal bleeding and vaginal discharge.  Musculoskeletal: Negative.  Negative for myalgias.  Neurological: Negative.      Physical Exam Updated Vital Signs BP 126/82 (BP Location: Right Arm)   Pulse 76   Temp 98.5 F (36.9 C) (Oral)   Resp 16   LMP 11/01/2017   SpO2 98%   Physical Exam  Constitutional: She is oriented to person, place, and time. She appears well-developed and well-nourished.  Neck: Normal range of motion.  Pulmonary/Chest: Effort normal.  Abdominal: Soft. She exhibits no distension. There is no tenderness.  Genitourinary:  Genitourinary Comments: No CVA tenderness.   Neurological: She is alert and oriented to person, place, and time.  Skin: Skin is warm and dry.     ED Treatments / Results  Labs (all labs ordered are listed, but only abnormal results are displayed) Labs Reviewed  URINALYSIS, ROUTINE W REFLEX MICROSCOPIC - Abnormal; Notable for the following components:      Result Value   APPearance CLOUDY (*)    Leukocytes, UA SMALL (*)    Bacteria, UA MANY (*)    All other components within normal limits  PREGNANCY, URINE   Results for orders placed or performed during the hospital encounter of 11/26/17  Urinalysis, Routine w reflex microscopic  Result Value Ref Range   Color, Urine YELLOW YELLOW   APPearance CLOUDY (A) CLEAR   Specific Gravity, Urine 1.029 1.005 - 1.030   pH 5.0 5.0 - 8.0   Glucose, UA NEGATIVE NEGATIVE mg/dL   Hgb urine dipstick NEGATIVE NEGATIVE  Bilirubin Urine NEGATIVE NEGATIVE   Ketones, ur NEGATIVE NEGATIVE mg/dL   Protein, ur NEGATIVE NEGATIVE mg/dL   Nitrite NEGATIVE NEGATIVE   Leukocytes, UA SMALL (A) NEGATIVE   RBC / HPF 6-10 0 - 5 RBC/hpf   WBC, UA 11-20 0 - 5 WBC/hpf   Bacteria, UA MANY (A) NONE SEEN   Squamous Epithelial / LPF 0-5 0 - 5   Mucus PRESENT    Budding Yeast PRESENT   Pregnancy, urine  Result Value Ref Range   Preg Test, Ur NEGATIVE NEGATIVE    EKG None  Radiology No results  found.  Procedures Procedures (including critical care time)  Medications Ordered in ED Medications - No data to display   Initial Impression / Assessment and Plan / ED Course  I have reviewed the triage vital signs and the nursing notes.  Pertinent labs & imaging results that were available during my care of the patient were reviewed by me and considered in my medical decision making (see chart for details).     Patient presents with symptoms of dysuria with UA that shows bacteria and WBCs c/w UTI. No other symptoms. She specifically denies vaginal discharge. A pelvic exam was offered but the patient is not concerned she has a pelvic infection.   Started on Keflex. Pyridium Rx given that she can fill if dysuria worsens.   Final Clinical Impressions(s) / ED Diagnoses   Final diagnoses:  None   1. Uncomplicated acute cystitis  ED Discharge Orders    None       Elpidio Anis, PA-C 11/27/17 0000    Azalia Bilis, MD 11/27/17 [phone]

## 2017-11-26 NOTE — ED Triage Notes (Signed)
Pt states that the past few hours she has been having dysuria and over the past few days some extra vaginal discharge.

## 2017-11-27 NOTE — ED Notes (Signed)
Pt refused discharge vitals 

## 2017-12-13 ENCOUNTER — Other Ambulatory Visit: Payer: Self-pay

## 2017-12-13 ENCOUNTER — Ambulatory Visit (HOSPITAL_COMMUNITY)
Admission: EM | Admit: 2017-12-13 | Discharge: 2017-12-13 | Disposition: A | Discharge status: Home / Self Care | Payer: 59 | Attending: Family Medicine | Admitting: Family Medicine

## 2017-12-13 ENCOUNTER — Encounter (HOSPITAL_COMMUNITY): Payer: Self-pay | Admitting: Emergency Medicine

## 2017-12-13 DIAGNOSIS — E282 Polycystic ovarian syndrome: Secondary | ICD-10-CM | POA: Insufficient documentation

## 2017-12-13 DIAGNOSIS — N3000 Acute cystitis without hematuria: Secondary | ICD-10-CM

## 2017-12-13 LAB — POCT URINALYSIS DIP (DEVICE)
Bilirubin Urine: NEGATIVE
Glucose, UA: NEGATIVE mg/dL
KETONES UR: NEGATIVE mg/dL
Nitrite: POSITIVE — AB
PROTEIN: NEGATIVE mg/dL
Specific Gravity, Urine: <=1.005 (ref 1.005–1.030)
Urobilinogen, UA: 0.2 mg/dL (ref 0.0–1.0)
pH: 5.5 (ref 5.0–8.0)

## 2017-12-13 MED ORDER — NITROFURANTOIN MONOHYD MACRO 100 MG PO CAPS: 100.0000 mg | ORAL_CAPSULE | Freq: Two times a day (BID) | ORAL | 0 refills | Status: AC

## 2017-12-13 NOTE — ED Provider Notes (Signed)
[facility]    CSN: [id number] Arrival date & time: 12/13/17  1601     History   Chief Complaint No chief complaint on file.   HPI Cheryl Riddle is a 22 y.o. female.   Cheryl Riddle presents with complaints of urinary symptoms which started this morning. Was seen and treated for UTI 9/4 with keflex. States she felt it made her break out so she stopped after three days of taking. Her symptoms had improved. States today noted that she has burning with urination. No frequency. Drinking a lot of water. No abdominal or back pain. No fevers. LMP 8/8, she is on birth control. Irregular periods as she has PCOS. Denies  Any other vaginal symptoms.    ROS per HPI.      History reviewed. No pertinent past medical history.  Patient Active Problem List   Diagnosis Date Noted  . Encounter for initial prescription of transdermal patch hormonal contraceptive device 02/05/2017    History reviewed. No pertinent surgical history.  OB History   None      Home Medications    Prior to Admission medications   Medication Sig Start Date End Date Taking? Authorizing Provider  nitrofurantoin, macrocrystal-monohydrate, (MACROBID) 100 MG capsule Take 1 capsule (100 mg total) by mouth 2 (two) times daily for 5 days. 12/13/17 12/18/17  Georgetta Haber, NP    Family History No family history on file.  Social History Social History   Tobacco Use  . Smoking status: Never Smoker  . Smokeless tobacco: Never Used  Substance Use Topics  . Alcohol use: Never    Frequency: Never  . Drug use: Never     Allergies   Sulfa antibiotics and Sulfur   Review of Systems Review of Systems   Physical Exam Triage Vital Signs ED Triage Vitals  Enc Vitals Group     BP 12/13/17 1728 133/84     Pulse Rate 12/13/17 1728 67     Resp 12/13/17 1728 17     Temp 12/13/17 1728 98.1 F (36.7 C)     Temp Source 12/13/17 1728 Oral     SpO2 --      Weight 12/13/17 1729 150 lb (68 kg)     Height  12/13/17 1729 5' 3.5" (1.613 m)     Head Circumference --      Peak Flow --      Pain Score 12/13/17 1729 2     Pain Loc --      Pain Edu? --      Excl. in GC? --    No data found.  Updated Vital Signs BP 133/84 (BP Location: Left Arm)   Pulse 67   Temp 98.1 F (36.7 C) (Oral)   Resp 17   Ht 5' 3.5" (1.613 m)   Wt 150 lb (68 kg)   LMP 11/07/2017   BMI 26.15 kg/m   Visual Acuity Right Eye Distance:   Left Eye Distance:   Bilateral Distance:    Right Eye Near:   Left Eye Near:    Bilateral Near:     Physical Exam  Constitutional: She is oriented to person, place, and time. She appears well-developed and well-nourished. No distress.  Cardiovascular: Normal rate, regular rhythm and normal heart sounds.  Pulmonary/Chest: Effort normal and breath sounds normal.  Abdominal: Soft. There is no tenderness. There is no rigidity, no rebound, no guarding and no CVA tenderness.  Neurological: She is alert and oriented to person, place, and time.  Skin: Skin is warm and dry.     UC Treatments / Results  Labs (all labs ordered are listed, but only abnormal results are displayed) Labs Reviewed  POCT URINALYSIS DIP (DEVICE) - Abnormal; Notable for the following components:      Result Value   Hgb urine dipstick TRACE (*)    Nitrite POSITIVE (*)    Leukocytes, UA TRACE (*)    All other components within normal limits  URINE CULTURE    EKG None  Radiology No results found.  Procedures Procedures (including critical care time)  Medications Ordered in UC Medications - No data to display  Initial Impression / Assessment and Plan / UC Course  I have reviewed the triage vital signs and the nursing notes.  Pertinent labs & imaging results that were available during my care of the patient were reviewed by me and considered in my medical decision making (see chart for details).     ua is consistent with UTI at this time. Sent for culture as recently on keflex as well.  Negative for pregnancy in clinic today. Encouraged to complete entire course. Return precautions provided. Patient verbalized understanding and agreeable to plan.    Final Clinical Impressions(s) / UC Diagnoses   Final diagnoses:  Acute cystitis without hematuria     Discharge Instructions     Complete course of antibiotics.  Drink plenty of water to empty bladder regularly. Avoid alcohol and caffeine as these may irritate the bladder.  If symptoms worsen or do not improve in the next week to return to be seen or to follow up with your PCP.     ED Prescriptions    Medication Sig Dispense Auth. Provider   nitrofurantoin, macrocrystal-monohydrate, (MACROBID) 100 MG capsule Take 1 capsule (100 mg total) by mouth 2 (two) times daily for 5 days. 10 capsule Georgetta HaberBurky, Natalie B, NP     Controlled Substance Prescriptions [place] Controlled Substance Registry consulted? Not Applicable   Georgetta HaberBurky, Natalie B, NP 12/13/17 [phone]

## 2017-12-13 NOTE — ED Triage Notes (Signed)
Pt. Stated, I had a UTI and given Keflex and it made me irritated and itching, so I stopped it and I think I still have the UTI

## 2017-12-13 NOTE — Discharge Instructions (Signed)
Complete course of antibiotics.  Drink plenty of water to empty bladder regularly. Avoid alcohol and caffeine as these may irritate the bladder.   If symptoms worsen or do not improve in the next week to return to be seen or to follow up with your PCP.   

## 2017-12-14 LAB — URINE CULTURE: Culture: <10000 — AB

## 2018-05-03 ENCOUNTER — Other Ambulatory Visit: Payer: Self-pay

## 2018-05-03 ENCOUNTER — Emergency Department (HOSPITAL_COMMUNITY)
Admission: EM | Admit: 2018-05-03 | Discharge: 2018-05-03 | Disposition: A | Discharge status: Home / Self Care | Payer: 59 | Attending: Emergency Medicine | Admitting: Emergency Medicine

## 2018-05-03 ENCOUNTER — Encounter (HOSPITAL_COMMUNITY): Payer: Self-pay

## 2018-05-03 DIAGNOSIS — J069 Acute upper respiratory infection, unspecified: Secondary | ICD-10-CM | POA: Diagnosis not present

## 2018-05-03 DIAGNOSIS — R05 Cough: Secondary | ICD-10-CM | POA: Diagnosis present

## 2018-05-03 HISTORY — DX: Polycystic ovarian syndrome: E28.2

## 2018-05-03 MED ORDER — DOXYCYCLINE HYCLATE 100 MG PO CAPS: 100.0000 mg | ORAL_CAPSULE | Freq: Two times a day (BID) | ORAL | 0 refills | Status: DC

## 2018-05-03 MED ORDER — BENZONATATE 100 MG PO CAPS: 100.0000 mg | ORAL_CAPSULE | Freq: Three times a day (TID) | ORAL | 0 refills | Status: DC

## 2018-05-03 NOTE — ED Triage Notes (Signed)
Pt reports productive cough since yesterday and congestion. Sore throat.

## 2018-05-03 NOTE — Discharge Instructions (Signed)
Doxycycline twice a day for 10 days, Tessalon 100 mg every 8 hours as needed  Doxycycline 100mg  by mouth twice daily until the medicine is completely finished - this medicine is a strong antibiotic that treats certain infections.   Please take Tessalon 100mg  by mouth 3 times daily for cough as needed.   Thank you for letting us take care of you today!  Please obtain all of your results from medical records or have your doctors office obtain the results - share them with your doctor - you should be seen at your doctors office in the next 2 days. Call today to arrange your follow up. Take the medications as prescribed. Please review all of the medicines and only take them if you do not have an allergy to them. Please be aware that if you are taking birth control pills, taking other prescriptions, ESPECIALLY ANTIBIOTICS may make the birth control ineffective - if this is the case, either do not engage in sexual activity or use alternative methods of birth control such as condoms until you have finished the medicine and your family doctor says it is OK to restart them. If you are on a blood thinner such as COUMADIN, be aware that any other medicine that you take may cause the coumadin to either work too much, or not enough - you should have your coumadin level rechecked in next 7 days if this is the case.  ?  It is also a possibility that you have an allergic reaction to any of the medicines that you have been prescribed - Everybody reacts differently to medications and while MOST people have no trouble with most medicines, you may have a reaction such as nausea, vomiting, rash, swelling, shortness of breath. If this is the case, please stop taking the medicine immediately and contact your physician.   If you were given a medication in the ED such as percocet, vicodin, or morphine, be aware that these medicines are sedating and may change your ability to take care of yourself adequately for several hours  after being given this medicines - you should not drive or take care of small children if you were given this medicine in the Emergency Department or if you have been prescribed these types of medicines. ?   You should return to the ER IMMEDIATELY if you develop severe or worsening symptoms.

## 2018-05-03 NOTE — ED Provider Notes (Signed)
[facility] EMERGENCY DEPARTMENT Provider Note   CSN: [id number] Arrival date & time: 05/03/18  1831     History   Chief Complaint Chief Complaint  Patient presents with  . Cough  . Sore Throat  . Nasal Congestion    HPI Cheryl Riddle is a 23 y.o. female.  HPI  23 year old female, she is otherwise healthy except for polycystic ovarian syndrome, she presents with approximately a week and a half of symptoms which initially started as nasal drainage congestion, postnasal drip and facial fullness which gradually improved but then was left with a purulent producing cough.  She has no fevers but has increasing coughing especially at night.  This is not getting any better with supportive medications.  Denies fevers, chest pain or back pain  Past Medical History:  Diagnosis Date  . PCOS (polycystic ovarian syndrome)     Patient Active Problem List   Diagnosis Date Noted  . Encounter for initial prescription of transdermal patch hormonal contraceptive device 02/05/2017    History reviewed. No pertinent surgical history.   OB History   No obstetric history on file.      Home Medications    Prior to Admission medications   Medication Sig Start Date End Date Taking? Authorizing Provider  benzonatate (TESSALON) 100 MG capsule Take 1 capsule (100 mg total) by mouth every 8 (eight) hours. 05/03/18   Eber Hong, MD  doxycycline (VIBRAMYCIN) 100 MG capsule Take 1 capsule (100 mg total) by mouth 2 (two) times daily. 05/03/18   Eber Hong, MD    Family History No family history on file.  Social History Social History   Tobacco Use  . Smoking status: Never Smoker  . Smokeless tobacco: Never Used  Substance Use Topics  . Alcohol use: Never    Frequency: Never  . Drug use: Never     Allergies   Keflex [cephalexin]; Sulfa antibiotics; and Sulfur   Review of Systems Review of Systems  Constitutional: Negative for fever.  HENT: Negative for sore  throat.   Respiratory: Positive for cough.      Physical Exam Updated Vital Signs BP (!) 159/96   Pulse 91   Temp 99.2 F (37.3 C) (Oral)   Resp 16   SpO2 100%   Physical Exam Vitals signs and nursing note reviewed.  Constitutional:      Appearance: She is well-developed. She is not diaphoretic.  HENT:     Head: Normocephalic and atraumatic.     Mouth/Throat:     Comments: Oropharynx is clear and moist with some purulent drainage down the back of the naso-posterior oropharynx Eyes:     General:        Right eye: No discharge.        Left eye: No discharge.     Conjunctiva/sclera: Conjunctivae normal.  Pulmonary:     Effort: Pulmonary effort is normal. No respiratory distress.     Breath sounds: No wheezing or rales.     Comments: Lung sounds are clear, speaking in full sentences Skin:    General: Skin is warm and dry.     Findings: No erythema or rash.  Neurological:     Mental Status: She is alert.     Coordination: Coordination normal.      ED Treatments / Results  Labs (all labs ordered are listed, but only abnormal results are displayed) Labs Reviewed - No data to display  EKG None  Radiology No results found.  Procedures Procedures (including  critical care time)  Medications Ordered in ED Medications - No data to display   Initial Impression / Assessment and Plan / ED Course  I have reviewed the triage vital signs and the nursing notes.  Pertinent labs & imaging results that were available during my care of the patient were reviewed by me and considered in my medical decision making (see chart for details).     Likely sinusitis turning into bronchitis, she appears well, trial of doxycycline and Tessalon.  Final Clinical Impressions(s) / ED Diagnoses   Final diagnoses:  Viral upper respiratory tract infection    ED Discharge Orders         Ordered    benzonatate (TESSALON) 100 MG capsule  Every 8 hours     05/03/18 1847    doxycycline  (VIBRAMYCIN) 100 MG capsule  2 times daily     05/03/18 1847           Eber HongMiller, Thyra Yinger, MD 05/03/18 [phone]

## 2018-11-18 ENCOUNTER — Emergency Department (HOSPITAL_COMMUNITY)
Admission: EM | Admit: 2018-11-18 | Discharge: 2018-11-18 | Disposition: A | Discharge status: Home / Self Care | Payer: 59 | Attending: Emergency Medicine | Admitting: Emergency Medicine

## 2018-11-18 ENCOUNTER — Encounter (HOSPITAL_COMMUNITY): Payer: Self-pay | Admitting: *Deleted

## 2018-11-18 ENCOUNTER — Other Ambulatory Visit: Payer: Self-pay

## 2018-11-18 DIAGNOSIS — N3000 Acute cystitis without hematuria: Secondary | ICD-10-CM

## 2018-11-18 DIAGNOSIS — Z87440 Personal history of urinary (tract) infections: Secondary | ICD-10-CM | POA: Insufficient documentation

## 2018-11-18 LAB — URINALYSIS, ROUTINE W REFLEX MICROSCOPIC
Bilirubin Urine: NEGATIVE
Glucose, UA: NEGATIVE mg/dL
Ketones, ur: NEGATIVE mg/dL
Nitrite: NEGATIVE
Protein, ur: NEGATIVE mg/dL
Specific Gravity, Urine: 1.013 (ref 1.005–1.030)
pH: 6 (ref 5.0–8.0)

## 2018-11-18 LAB — POC URINE PREG, ED: Preg Test, Ur: NEGATIVE

## 2018-11-18 MED ORDER — NITROFURANTOIN MONOHYD MACRO 100 MG PO CAPS: 100.0000 mg | ORAL_CAPSULE | Freq: Two times a day (BID) | ORAL | 0 refills | Status: DC

## 2018-11-18 NOTE — ED Provider Notes (Signed)
[place] EMERGENCY DEPARTMENT Provider Note   CSN: [id number] Arrival date & time: 11/18/18  1324     History   Chief Complaint Chief Complaint  Patient presents with  . Dysuria    HPI Cheryl Riddle is a 23 y.o. female.     HPI   23 year old female presents today with complaints of dysuria.  She notes 4 days of burning with urination.  She notes it improved with drinking water but worsened with drinking soda.  She notes this is similar to previous urinary tract infection she had approximately 8 months ago.  No abdominal pain flank pain fever nausea or vomiting.  She notes taking Macrobid previously resolved her symptoms.     Past Medical History:  Diagnosis Date  . PCOS (polycystic ovarian syndrome)     Patient Active Problem List   Diagnosis Date Noted  . Encounter for initial prescription of transdermal patch hormonal contraceptive device 02/05/2017    History reviewed. No pertinent surgical history.   OB History   No obstetric history on file.      Home Medications    Prior to Admission medications   Medication Sig Start Date End Date Taking? Authorizing Provider  nitrofurantoin, macrocrystal-monohydrate, (MACROBID) 100 MG capsule Take 1 capsule (100 mg total) by mouth 2 (two) times daily. 11/18/18   Okey Regal, PA-C    Family History No family history on file.  Social History Social History   Tobacco Use  . Smoking status: Never Smoker  . Smokeless tobacco: Never Used  Substance Use Topics  . Alcohol use: Never    Frequency: Never  . Drug use: Never     Allergies   Keflex [cephalexin], Sulfa antibiotics, and Sulfur   Review of Systems Review of Systems  All other systems reviewed and are negative.    Physical Exam Updated Vital Signs BP (!) 131/94 (BP Location: Right Arm)   Pulse 60   Temp 98.2 F (36.8 C) (Oral)   Resp 14   Ht 5\' 3"  (1.6 m)   Wt 77.1 kg   LMP 08/23/2018   SpO2 100%   BMI 30.11  kg/m   Physical Exam Vitals signs and nursing note reviewed.  Constitutional:      Appearance: She is well-developed.  HENT:     Head: Normocephalic and atraumatic.  Eyes:     General: No scleral icterus.       Right eye: No discharge.        Left eye: No discharge.     Conjunctiva/sclera: Conjunctivae normal.     Pupils: Pupils are equal, round, and reactive to light.  Neck:     Musculoskeletal: Normal range of motion.     Vascular: No JVD.     Trachea: No tracheal deviation.  Pulmonary:     Effort: Pulmonary effort is normal.     Breath sounds: No stridor.  Abdominal:     General: Abdomen is flat. There is no distension.     Tenderness: There is no abdominal tenderness.  Neurological:     Mental Status: She is alert and oriented to person, place, and time.     Coordination: Coordination normal.  Psychiatric:        Behavior: Behavior normal.        Thought Content: Thought content normal.        Judgment: Judgment normal.      ED Treatments / Results  Labs (all labs ordered are listed, but only abnormal results are  displayed) Labs Reviewed  URINALYSIS, ROUTINE W REFLEX MICROSCOPIC - Abnormal; Notable for the following components:      Result Value   Hgb urine dipstick SMALL (*)    Leukocytes,Ua MODERATE (*)    Bacteria, UA RARE (*)    Non Squamous Epithelial 6-10 (*)    All other components within normal limits  POC URINE PREG, ED    EKG None  Radiology No results found.  Procedures Procedures (including critical care time)  Medications Ordered in ED Medications - No data to display   Initial Impression / Assessment and Plan / ED Course  I have reviewed the triage vital signs and the nursing notes.  Pertinent labs & imaging results that were available during my care of the patient were reviewed by me and considered in my medical decision making (see chart for details).         23 year old female with uncomplicated urinary tract infection.   Patient be treated with Macrobid discharged with return precautions.  She verbalized understanding and agreement to today's plan.  Final Clinical Impressions(s) / ED Diagnoses   Final diagnoses:  Acute cystitis without hematuria    ED Discharge Orders         Ordered    nitrofurantoin, macrocrystal-monohydrate, (MACROBID) 100 MG capsule  2 times daily     11/18/18 1559           Eyvonne MechanicHedges, Madelin Weseman, PA-C 11/18/18 1600    Loren RacerYelverton, David, MD 11/23/18 2231

## 2018-11-18 NOTE — Discharge Instructions (Addendum)
Please read attached information. If you experience any new or worsening signs or symptoms please return to the emergency room for evaluation. Please follow-up with your primary care provider or specialist as discussed. Please use medication prescribed only as directed and discontinue taking if you have any concerning signs or symptoms.   °

## 2018-11-18 NOTE — ED Triage Notes (Signed)
Pt states "sensation" after she urinates.  She's worried she has a uti.

## 2018-11-18 NOTE — ED Notes (Signed)
Patient verbalizes understanding of discharge instructions. Opportunity for questioning and answers were provided. Armband removed by staff, pt discharged from ED.  

## 2019-01-12 ENCOUNTER — Ambulatory Visit (INDEPENDENT_AMBULATORY_CARE_PROVIDER_SITE_OTHER): Payer: Self-pay | Admitting: Primary Care

## 2019-01-12 ENCOUNTER — Other Ambulatory Visit: Payer: Self-pay

## 2019-01-12 ENCOUNTER — Encounter (INDEPENDENT_AMBULATORY_CARE_PROVIDER_SITE_OTHER): Payer: Self-pay | Admitting: Primary Care

## 2019-01-12 VITALS — BP 127/85 | HR 68 | Temp 97.8°F | Ht 63.0 in | Wt 182.8 lb

## 2019-01-12 DIAGNOSIS — E6609 Other obesity due to excess calories: Secondary | ICD-10-CM

## 2019-01-12 DIAGNOSIS — Z3009 Encounter for other general counseling and advice on contraception: Secondary | ICD-10-CM

## 2019-01-12 DIAGNOSIS — Z3202 Encounter for pregnancy test, result negative: Secondary | ICD-10-CM

## 2019-01-12 DIAGNOSIS — Z309 Encounter for contraceptive management, unspecified: Secondary | ICD-10-CM

## 2019-01-12 DIAGNOSIS — Z6832 Body mass index (BMI) 32.0-32.9, adult: Secondary | ICD-10-CM

## 2019-01-12 DIAGNOSIS — Z760 Encounter for issue of repeat prescription: Secondary | ICD-10-CM

## 2019-01-12 DIAGNOSIS — R638 Other symptoms and signs concerning food and fluid intake: Secondary | ICD-10-CM

## 2019-01-12 DIAGNOSIS — Z7689 Persons encountering health services in other specified circumstances: Secondary | ICD-10-CM

## 2019-01-12 LAB — POCT URINE PREGNANCY: Preg Test, Ur: NEGATIVE

## 2019-01-12 MED ORDER — NORETHIN-ETH ESTRADIOL-FE 0.8-25 MG-MCG PO CHEW: 1.0000 | CHEWABLE_TABLET | Freq: Every day | ORAL | 11 refills | Status: DC

## 2019-01-12 NOTE — Progress Notes (Signed)
New Patient Office Visit  Subjective:  Patient ID: Cheryl Riddle, female    DOB: 1995/10/20  Age: 23 y.o. MRN: [id number]  CC:  Chief Complaint  Patient presents with  . Contraception    HPI Cheryl Riddle presents for establishment of care. She voices concerns about refilling her birthcontrol Question why she was on birthcontrol and she wanted to get pregnant.  Previously told by GYN this would help with ovulation.   Past Medical History:  Diagnosis Date  . PCOS (polycystic ovarian syndrome)     No past surgical history on file.  No family history on file.  Social History   Socioeconomic History  . Marital status: Single    Spouse name: Not on file  . Number of children: Not on file  . Years of education: Not on file  . Highest education level: Not on file  Occupational History  . Not on file  Social Needs  . Financial resource strain: Not on file  . Food insecurity    Worry: Not on file    Inability: Not on file  . Transportation needs    Medical: Not on file    Non-medical: Not on file  Tobacco Use  . Smoking status: Never Smoker  . Smokeless tobacco: Never Used  Substance and Sexual Activity  . Alcohol use: Never    Frequency: Never  . Drug use: Never  . Sexual activity: Not on file  Lifestyle  . Physical activity    Days per week: Not on file    Minutes per session: Not on file  . Stress: Not on file  Relationships  . Social Herbalist on phone: Not on file    Gets together: Not on file    Attends religious service: Not on file    Active member of club or organization: Not on file    Attends meetings of clubs or organizations: Not on file    Relationship status: Not on file  . Intimate partner violence    Fear of current or ex partner: Not on file    Emotionally abused: Not on file    Physically abused: Not on file    Forced sexual activity: Not on file  Other Topics Concern  . Not on file  Social History Narrative  . Not on file     ROS Review of Systems  Gastrointestinal:       Nausea when hungry  Genitourinary: Positive for menstrual problem.    Objective:   Today's Vitals: BP 127/85 (BP Location: Left Arm, Patient Position: Sitting, Cuff Size: Large)   Pulse 68   Temp 97.8 F (36.6 C) (Temporal)   Ht 5\' 3"  (1.6 m)   Wt 182 lb 12.8 oz (82.9 kg)   LMP 12/21/2018 (Exact Date)   SpO2 98%   BMI 32.38 kg/m   Physical Exam Vitals signs reviewed.  Constitutional:      Appearance: Normal appearance.  HENT:     Head: Normocephalic.     Right Ear: Tympanic membrane normal.     Left Ear: Tympanic membrane normal.  Eyes:     Pupils: Pupils are equal, round, and reactive to light.  Neck:     Musculoskeletal: Normal range of motion.  Cardiovascular:     Rate and Rhythm: Normal rate and regular rhythm.  Pulmonary:     Effort: Pulmonary effort is normal.     Breath sounds: Normal breath sounds.  Abdominal:     General: Abdomen  is flat. Bowel sounds are normal.     Palpations: Abdomen is soft.  Musculoskeletal: Normal range of motion.  Skin:    General: Skin is warm and dry.  Neurological:     General: No focal deficit present.     Mental Status: She is alert and oriented to person, place, and time.  Psychiatric:        Mood and Affect: Mood normal.        Behavior: Behavior normal.     Assessment & Plan:  Cheryl Riddle was seen today for contraception.  Diagnoses and all orders for this visit  Establishing care with new doctor, encounter for Gwinda Passe, NP-C will be your  (PCP) that will  provides primary care discuss  health concern as well as continuing care of varied medical conditions, not limited by cause, organ system, or diagnosis.   Encounter for contraceptive management, unspecified type -     POCT urine pregnancy  Encounter for other general counseling or advice on contraception On medication to regulate menstrual cycles and ovulation   Class 1 obesity due to excess calories  without serious comorbidity with body mass index Obesity is 30-39 indicating an excess in caloric intake or underlining conditions. This may lead to other co-morbidities family history of HTN and diabetes . Lifestyle modifications of diet and exercise may reduce obesity.   Medication refill Norethindrone-Ethinyl Estradiol-Fe (LAYOLIS FE) 0.8-25 MG-MCG tablet; Chew 1 tablet by mouth daily.   Other orders -       Outpatient Encounter Medications as of 01/12/2019  Medication Sig  . Norethindrone-Ethinyl Estradiol-Fe (LAYOLIS FE) 0.8-25 MG-MCG tablet Chew 1 tablet by mouth daily.  . [DISCONTINUED] nitrofurantoin, macrocrystal-monohydrate, (MACROBID) 100 MG capsule Take 1 capsule (100 mg total) by mouth 2 (two) times daily.   No facility-administered encounter medications on file as of 01/12/2019.     Follow-up: No follow-ups on file.   Grayce Sessions, NP

## 2019-01-12 NOTE — Patient Instructions (Signed)

## 2019-01-13 LAB — CBC WITH DIFFERENTIAL/PLATELET
Basophils Absolute: 0 10*3/uL (ref 0.0–0.2)
Basos: 1 %
EOS (ABSOLUTE): 0.1 10*3/uL (ref 0.0–0.4)
Eos: 2 %
Hematocrit: 35.7 % (ref 34.0–46.6)
Hemoglobin: 11.9 g/dL (ref 11.1–15.9)
Immature Grans (Abs): 0 10*3/uL (ref 0.0–0.1)
Immature Granulocytes: 0 %
Lymphocytes Absolute: 2.9 10*3/uL (ref 0.7–3.1)
Lymphs: 34 %
MCH: 28.5 pg (ref 26.6–33.0)
MCHC: 33.3 g/dL (ref 31.5–35.7)
MCV: 86 fL (ref 79–97)
Monocytes Absolute: 0.4 10*3/uL (ref 0.1–0.9)
Monocytes: 5 %
Neutrophils Absolute: 4.9 10*3/uL (ref 1.4–7.0)
Neutrophils: 58 %
Platelets: 337 10*3/uL (ref 150–450)
RBC: 4.17 x10E6/uL (ref 3.77–5.28)
RDW: 12.7 % (ref 11.7–15.4)
WBC: 8.4 10*3/uL (ref 3.4–10.8)

## 2019-01-13 LAB — CMP14+EGFR
ALT: 9 IU/L (ref 0–32)
AST: 13 IU/L (ref 0–40)
Albumin/Globulin Ratio: 1.7 (ref 1.2–2.2)
Albumin: 4.4 g/dL (ref 3.9–5.0)
Alkaline Phosphatase: 61 IU/L (ref 39–117)
BUN/Creatinine Ratio: 10 (ref 9–23)
BUN: 8 mg/dL (ref 6–20)
Bilirubin Total: 0.2 mg/dL (ref 0.0–1.2)
CO2: 24 mmol/L (ref 20–29)
Calcium: 9.5 mg/dL (ref 8.7–10.2)
Chloride: 105 mmol/L (ref 96–106)
Creatinine, Ser: 0.81 mg/dL (ref 0.57–1.00)
GFR calc Af Amer: 118 mL/min/{1.73_m2} (ref >59)
GFR calc non Af Amer: 103 mL/min/{1.73_m2} (ref >59)
Globulin, Total: 2.6 g/dL (ref 1.5–4.5)
Glucose: 78 mg/dL (ref 65–99)
Potassium: 4.1 mmol/L (ref 3.5–5.2)
Sodium: 140 mmol/L (ref 134–144)
Total Protein: 7 g/dL (ref 6.0–8.5)

## 2019-01-13 LAB — LIPID PANEL
Chol/HDL Ratio: 3 ratio (ref 0.0–4.4)
Cholesterol, Total: 176 mg/dL (ref 100–199)
HDL: 58 mg/dL (ref >39)
LDL Chol Calc (NIH): 102 mg/dL — ABNORMAL HIGH (ref 0–99)
Triglycerides: 89 mg/dL (ref 0–149)
VLDL Cholesterol Cal: 16 mg/dL (ref 5–40)

## 2019-01-14 ENCOUNTER — Telehealth (INDEPENDENT_AMBULATORY_CARE_PROVIDER_SITE_OTHER): Payer: Self-pay

## 2019-01-14 NOTE — Telephone Encounter (Signed)
Patient is aware that labs are normal. Cheryl Riddle, CMA  

## 2019-01-14 NOTE — Telephone Encounter (Signed)
-----   Message from Kerin Perna, NP sent at 01/14/2019  4:14 PM EDT ----- I have reviewed all labs and they are normal

## 2019-02-19 ENCOUNTER — Encounter (HOSPITAL_COMMUNITY): Payer: Self-pay | Admitting: Emergency Medicine

## 2019-02-19 ENCOUNTER — Other Ambulatory Visit: Payer: Self-pay

## 2019-02-19 ENCOUNTER — Emergency Department (HOSPITAL_COMMUNITY)
Admission: EM | Admit: 2019-02-19 | Discharge: 2019-02-19 | Disposition: A | Discharge status: Home / Self Care | Payer: Medicaid Other | Attending: Emergency Medicine | Admitting: Emergency Medicine

## 2019-02-19 DIAGNOSIS — Z79899 Other long term (current) drug therapy: Secondary | ICD-10-CM | POA: Insufficient documentation

## 2019-02-19 DIAGNOSIS — N390 Urinary tract infection, site not specified: Secondary | ICD-10-CM | POA: Insufficient documentation

## 2019-02-19 LAB — URINALYSIS, ROUTINE W REFLEX MICROSCOPIC
Bilirubin Urine: NEGATIVE
Glucose, UA: NEGATIVE mg/dL
Ketones, ur: NEGATIVE mg/dL
Nitrite: NEGATIVE
Protein, ur: NEGATIVE mg/dL
Specific Gravity, Urine: 1.024 (ref 1.005–1.030)
WBC, UA: >50 WBC/hpf — ABNORMAL HIGH (ref 0–5)
pH: 5 (ref 5.0–8.0)

## 2019-02-19 LAB — PREGNANCY, URINE: Preg Test, Ur: NEGATIVE

## 2019-02-19 MED ORDER — NITROFURANTOIN MONOHYD MACRO 100 MG PO CAPS
100.0000 mg | ORAL_CAPSULE | Freq: Once | ORAL | Status: AC
  Administered 2019-02-19: 09:00:00 100 mg via ORAL
  Filled 2019-02-19: qty 1

## 2019-02-19 MED ORDER — NITROFURANTOIN MONOHYD MACRO 100 MG PO CAPS: 100.0000 mg | ORAL_CAPSULE | Freq: Two times a day (BID) | ORAL | 0 refills | Status: DC

## 2019-02-19 NOTE — Discharge Instructions (Signed)
It was our pleasure to provide your ER care today - we hope that you feel better.  Take antibiotic as prescribed.   Drink plenty of fluids.   Follow up with primary care doctor in the next couple days if symptoms fail to improve/resolve.  Return to ER if worse, new symptoms, high fevers, worsening or severe pain, vomiting, or other concern.

## 2019-02-19 NOTE — ED Notes (Signed)
Pt verbalized understanding of d/c instructions and has no further questions, VSS, NAD.  

## 2019-02-19 NOTE — ED Provider Notes (Signed)
[facility] EMERGENCY DEPARTMENT Provider Note   CSN: [id number] Arrival date & time: 02/19/19  6967     History   Chief Complaint Chief Complaint  Patient presents with  . Dysuria    HPI Cheryl Riddle is a 23 y.o. female.     Patient c/o dysuria, and urgency in past 1-2 days. Feels same as prior utis. No recent abx use. Symptoms acute onset, moderate, persistent, worse this AM. Denies abdominal pain or back/flank pain. No hematuria. No vaginal discharge or bleeding. lnmp 2-3 weeks ago. No fever or chills. No nv. Apart from gu symptoms, does not feel sick or ill. Normal appetite.   The history is provided by the patient.  Dysuria Associated symptoms: no abdominal pain, no fever, no flank pain, no nausea and no vomiting     Past Medical History:  Diagnosis Date  . PCOS (polycystic ovarian syndrome)     Patient Active Problem List   Diagnosis Date Noted  . Encounter for initial prescription of transdermal patch hormonal contraceptive device 02/05/2017    History reviewed. No pertinent surgical history.   OB History   No obstetric history on file.      Home Medications    Prior to Admission medications   Medication Sig Start Date End Date Taking? Authorizing Provider  Norethindrone-Ethinyl Estradiol-Fe (LAYOLIS FE) 0.8-25 MG-MCG tablet Chew 1 tablet by mouth daily. 01/12/19   Grayce Sessions, NP    Family History No family history on file.  Social History Social History   Tobacco Use  . Smoking status: Never Smoker  . Smokeless tobacco: Never Used  Substance Use Topics  . Alcohol use: Never    Frequency: Never  . Drug use: Never     Allergies   Keflex [cephalexin], Sulfa antibiotics, and Sulfur   Review of Systems Review of Systems  Constitutional: Negative for fever.  HENT: Negative for sore throat.   Eyes: Negative for redness.  Respiratory: Negative for cough.   Cardiovascular: Negative for chest pain.   Gastrointestinal: Negative for abdominal pain, nausea and vomiting.  Genitourinary: Positive for dysuria. Negative for flank pain.  Musculoskeletal: Negative for back pain.  Skin: Negative for rash.  Neurological: Negative for headaches.  Hematological: Does not bruise/bleed easily.  Psychiatric/Behavioral: Negative for confusion.     Physical Exam Updated Vital Signs BP 125/89 (BP Location: Left Arm)   Pulse 81   Temp 98.2 F (36.8 C) (Oral)   Resp 16   Ht 1.613 m (5' 3.5")   Wt 81.6 kg   LMP 01/30/2019   SpO2 98%   BMI 31.39 kg/m   Physical Exam Vitals signs and nursing note reviewed.  Constitutional:      Appearance: Normal appearance. She is well-developed.  HENT:     Head: Atraumatic.     Nose: Nose normal.     Mouth/Throat:     Mouth: Mucous membranes are moist.  Eyes:     General: No scleral icterus.    Conjunctiva/sclera: Conjunctivae normal.  Neck:     Musculoskeletal: Neck supple.     Trachea: No tracheal deviation.  Cardiovascular:     Rate and Rhythm: Normal rate.  Pulmonary:     Effort: Pulmonary effort is normal. No respiratory distress.  Abdominal:     General: Bowel sounds are normal. There is no distension.     Palpations: Abdomen is soft.     Tenderness: There is no abdominal tenderness. There is no guarding.  Genitourinary:  Comments: No cva tenderness.  Musculoskeletal:        General: No swelling.  Skin:    General: Skin is warm and dry.     Findings: No rash.  Neurological:     Mental Status: She is alert.     Comments: Alert, speech normal.   Psychiatric:        Mood and Affect: Mood normal.      ED Treatments / Results  Labs (all labs ordered are listed, but only abnormal results are displayed) Results for orders placed or performed during the hospital encounter of 02/19/19  Urine pregnancy  Result Value Ref Range   Preg Test, Ur NEGATIVE NEGATIVE  UA  Result Value Ref Range   Color, Urine YELLOW YELLOW   APPearance  HAZY (A) CLEAR   Specific Gravity, Urine 1.024 1.005 - 1.030   pH 5.0 5.0 - 8.0   Glucose, UA NEGATIVE NEGATIVE mg/dL   Hgb urine dipstick SMALL (A) NEGATIVE   Bilirubin Urine NEGATIVE NEGATIVE   Ketones, ur NEGATIVE NEGATIVE mg/dL   Protein, ur NEGATIVE NEGATIVE mg/dL   Nitrite NEGATIVE NEGATIVE   Leukocytes,Ua MODERATE (A) NEGATIVE   RBC / HPF 11-20 0 - 5 RBC/hpf   WBC, UA >50 (H) 0 - 5 WBC/hpf   Bacteria, UA RARE (A) NONE SEEN   Squamous Epithelial / LPF 0-5 0 - 5   Mucus PRESENT      EKG None  Radiology No results found.  Procedures Procedures (including critical care time)  Medications Ordered in ED Medications - No data to display   Initial Impression / Assessment and Plan / ED Course  I have reviewed the triage vital signs and the nursing notes.  Pertinent labs & imaging results that were available during my care of the patient were reviewed by me and considered in my medical decision making (see chart for details).  Labs sent.  Reviewed nursing notes and prior charts for additional history.   Confirmed allergic to keflex and sulfa.   Labs reviewed/interpreted by me - c/w uti.   rx macrobid.     Final Clinical Impressions(s) / ED Diagnoses   Final diagnoses:  None    ED Discharge Orders    None       Lajean Saver, MD 02/19/19 [phone]

## 2019-02-19 NOTE — ED Triage Notes (Signed)
Patient c/o painful urination with malodorous urine onset of yesterday. Pt reports hx of UTIs.

## 2019-02-24 ENCOUNTER — Telehealth: Payer: Self-pay | Admitting: General Practice

## 2019-02-24 NOTE — Telephone Encounter (Signed)
Charity application mailed to patient. 

## 2019-03-03 ENCOUNTER — Ambulatory Visit (INDEPENDENT_AMBULATORY_CARE_PROVIDER_SITE_OTHER): Payer: Medicaid Other | Admitting: Primary Care

## 2019-03-16 ENCOUNTER — Encounter (INDEPENDENT_AMBULATORY_CARE_PROVIDER_SITE_OTHER): Payer: Self-pay | Admitting: Primary Care

## 2019-03-16 ENCOUNTER — Ambulatory Visit (INDEPENDENT_AMBULATORY_CARE_PROVIDER_SITE_OTHER): Payer: Self-pay | Admitting: Primary Care

## 2019-03-16 ENCOUNTER — Other Ambulatory Visit: Payer: Self-pay

## 2019-03-16 VITALS — BP 125/81 | HR 74 | Temp 97.3°F | Ht 63.5 in | Wt 184.8 lb

## 2019-03-16 DIAGNOSIS — Z6832 Body mass index (BMI) 32.0-32.9, adult: Secondary | ICD-10-CM

## 2019-03-16 DIAGNOSIS — E6609 Other obesity due to excess calories: Secondary | ICD-10-CM

## 2019-03-16 DIAGNOSIS — M545 Low back pain, unspecified: Secondary | ICD-10-CM

## 2019-03-16 DIAGNOSIS — R21 Rash and other nonspecific skin eruption: Secondary | ICD-10-CM

## 2019-03-16 MED ORDER — HYDROXYZINE HCL 10 MG PO TABS: 10.0000 mg | ORAL_TABLET | Freq: Three times a day (TID) | ORAL | 0 refills | Status: AC | PRN

## 2019-03-16 MED ORDER — LANOLIN HYDROUS EX OINT: 1.0000 "application " | TOPICAL_OINTMENT | CUTANEOUS | 0 refills | Status: AC | PRN

## 2019-03-16 NOTE — Patient Instructions (Signed)
Contact Dermatitis Dermatitis is redness, soreness, and swelling (inflammation) of the skin. Contact dermatitis is a reaction to something that touches the skin. There are two types of contact dermatitis:  Irritant contact dermatitis. This happens when something bothers (irritates) your skin, like soap.  Allergic contact dermatitis. This is caused when you are exposed to something that you are allergic to, such as poison ivy. What are the causes?  Common causes of irritant contact dermatitis include: ? Makeup. ? Soaps. ? Detergents. ? Bleaches. ? Acids. ? Metals, such as nickel.  Common causes of allergic contact dermatitis include: ? Plants. ? Chemicals. ? Jewelry. ? Latex. ? Medicines. ? Preservatives in products, such as clothing. What increases the risk?  Having a job that exposes you to things that bother your skin.  Having asthma or eczema. What are the signs or symptoms? Symptoms may happen anywhere the irritant has touched your skin. Symptoms include:  Dry or flaky skin.  Redness.  Cracks.  Itching.  Pain or a burning feeling.  Blisters.  Blood or clear fluid draining from skin cracks. With allergic contact dermatitis, swelling may occur. This may happen in places such as the eyelids, mouth, or genitals. How is this treated?  This condition is treated by checking for the cause of the reaction and protecting your skin. Treatment may also include: ? Steroid creams, ointments, or medicines. ? Antibiotic medicines or other ointments, if you have a skin infection. ? Lotion or medicines to help with itching. ? A bandage (dressing). Follow these instructions at home: Skin care  Moisturize your skin as needed.  Put cool cloths on your skin.  Put a baking soda paste on your skin. Stir water into baking soda until it looks like a paste.  Do not scratch your skin.  Avoid having things rub up against your skin.  Avoid the use of soaps, perfumes, and dyes.  Medicines  Take or apply over-the-counter and prescription medicines only as told by your doctor.  If you were prescribed an antibiotic medicine, take or apply it as told by your doctor. Do not stop using it even if your condition starts to get better. Bathing  Take a bath with: ? Epsom salts. ? Baking soda. ? Colloidal oatmeal.  Bathe less often.  Bathe in warm water. Avoid using hot water. Bandage care  If you were given a bandage, change it as told by your health care provider.  Wash your hands with soap and water before and after you change your bandage. If soap and water are not available, use hand sanitizer. General instructions  Avoid the things that caused your reaction. If you do not know what caused it, keep a journal. Write down: ? What you eat. ? What skin products you use. ? What you drink. ? What you wear in the area that has symptoms. This includes jewelry.  Check the affected areas every day for signs of infection. Check for: ? More redness, swelling, or pain. ? More fluid or blood. ? Warmth. ? Pus or a bad smell.  Keep all follow-up visits as told by your doctor. This is important. Contact a doctor if:  You do not get better with treatment.  Your condition gets worse.  You have signs of infection, such as: ? More swelling. ? Tenderness. ? More redness. ? Soreness. ? Warmth.  You have a fever.  You have new symptoms. Get help right away if:  You have a very bad headache.  You have neck pain.    Your neck is stiff.  You throw up (vomit).  You feel very sleepy.  You see red streaks coming from the area.  Your bone or joint near the area hurts after the skin has healed.  The area turns darker.  You have trouble breathing. Summary  Dermatitis is redness, soreness, and swelling of the skin.  Symptoms may occur where the irritant has touched you.  Treatment may include medicines and skin care.  If you do not know what caused your  reaction, keep a journal.  Contact a doctor if your condition gets worse or you have signs of infection. This information is not intended to replace advice given to you by your health care provider. Make sure you discuss any questions you have with your health care provider. Document Released: 01/06/2009 Document Revised: 07/01/2018 Document Reviewed: 09/24/2017 Elsevier Patient Education  2020 [employer]  

## 2019-03-16 NOTE — Progress Notes (Signed)
Acute Office Visit  Subjective:    Patient ID: Cheryl Riddle, female    DOB: February 24, 1996, 23 y.o.   MRN: [id number]  Chief Complaint  Patient presents with  . Pruritis    under breast, raw spots on nipple from scratching     HPI Patient is in today for acute visit she has developed a rash under her breast that causes her to be uncomfortable, difficulty sleeping waking up to scratch -now her nipples are sore because she has scratch them to she feels like they are raw. She also, is concern about back pain and unclear of the cause.   Past Medical History:  Diagnosis Date  . PCOS (polycystic ovarian syndrome)     History reviewed. No pertinent surgical history.  History reviewed. No pertinent family history.  Social History   Socioeconomic History  . Marital status: Single    Spouse name: Not on file  . Number of children: Not on file  . Years of education: Not on file  . Highest education level: Not on file  Occupational History  . Not on file  Tobacco Use  . Smoking status: Never Smoker  . Smokeless tobacco: Never Used  Substance and Sexual Activity  . Alcohol use: Never  . Drug use: Never  . Sexual activity: Not on file  Other Topics Concern  . Not on file  Social History Narrative  . Not on file   Social Determinants of Health   Financial Resource Strain:   . Difficulty of Paying Living Expenses: Not on file  Food Insecurity:   . Worried About Charity fundraiser in the Last Year: Not on file  . Ran Out of Food in the Last Year: Not on file  Transportation Needs:   . Lack of Transportation (Medical): Not on file  . Lack of Transportation (Non-Medical): Not on file  Physical Activity:   . Days of Exercise per Week: Not on file  . Minutes of Exercise per Session: Not on file  Stress:   . Feeling of Stress : Not on file  Social Connections:   . Frequency of Communication with Friends and Family: Not on file  . Frequency of Social Gatherings with Friends  and Family: Not on file  . Attends Religious Services: Not on file  . Active Member of Clubs or Organizations: Not on file  . Attends Archivist Meetings: Not on file  . Marital Status: Not on file  Intimate Partner Violence:   . Fear of Current or Ex-Partner: Not on file  . Emotionally Abused: Not on file  . Physically Abused: Not on file  . Sexually Abused: Not on file    Outpatient Medications Prior to Visit  Medication Sig Dispense Refill  . nitrofurantoin, macrocrystal-monohydrate, (MACROBID) 100 MG capsule Take 1 capsule (100 mg total) by mouth 2 (two) times daily. 10 capsule 0  . Norethindrone-Ethinyl Estradiol-Fe (LAYOLIS FE) 0.8-25 MG-MCG tablet Chew 1 tablet by mouth daily. 1 Package 11   No facility-administered medications prior to visit.    Allergies  Allergen Reactions  . Keflex [Cephalexin]     Rash   . Sulfa Antibiotics Hives  . Sulfur     Review of Systems  Skin: Positive for color change and rash.  All other systems reviewed and are negative.      Objective:    Physical Exam Vitals reviewed.  Constitutional:      Appearance: Normal appearance. She is obese.  HENT:  Head: Normocephalic.     Nose: Nose normal.  Cardiovascular:     Rate and Rhythm: Normal rate and regular rhythm.  Pulmonary:     Effort: Pulmonary effort is normal.     Breath sounds: Normal breath sounds.  Abdominal:     General: Bowel sounds are normal.     Palpations: Abdomen is soft.  Musculoskeletal:        General: Normal range of motion.     Cervical back: Neck supple.  Skin:    Findings: Erythema and rash present.  Neurological:     General: No focal deficit present.     Mental Status: She is alert and oriented to person, place, and time.  Psychiatric:        Mood and Affect: Mood normal.        Thought Content: Thought content normal.     BP 125/81 (BP Location: Left Arm, Patient Position: Sitting, Cuff Size: Normal)   Pulse 74   Temp (!) 97.3 F  (36.3 C) (Temporal)   Ht 5' 3.5" (1.613 m)   Wt 184 lb 12.8 oz (83.8 kg)   LMP 03/02/2019 (Exact Date)   SpO2 100%   BMI 32.22 kg/m  Wt Readings from Last 3 Encounters:  03/16/19 184 lb 12.8 oz (83.8 kg)  02/19/19 180 lb (81.6 kg)  01/12/19 182 lb 12.8 oz (82.9 kg)    Health Maintenance Due  Topic Date Due  . HIV Screening  01/24/2010  . PAP-Cervical Cytology Screening  01/25/2016  . PAP SMEAR-Modifier  01/25/2016    There are no preventive care reminders to display for this patient.   Lab Results  Component Value Date   TSH 0.723 10/17/2016   Lab Results  Component Value Date   WBC 8.4 01/12/2019   HGB 11.9 01/12/2019   HCT 35.7 01/12/2019   MCV 86 01/12/2019   PLT 337 01/12/2019   Lab Results  Component Value Date   NA 140 01/12/2019   K 4.1 01/12/2019   CO2 24 01/12/2019   GLUCOSE 78 01/12/2019   BUN 8 01/12/2019   CREATININE 0.81 01/12/2019   BILITOT 0.2 01/12/2019   ALKPHOS 61 01/12/2019   AST 13 01/12/2019   ALT 9 01/12/2019   PROT 7.0 01/12/2019   ALBUMIN 4.4 01/12/2019   CALCIUM 9.5 01/12/2019   Lab Results  Component Value Date   CHOL 176 01/12/2019   Lab Results  Component Value Date   HDL 58 01/12/2019   Lab Results  Component Value Date   LDLCALC 102 (H) 01/12/2019   Lab Results  Component Value Date   TRIG 89 01/12/2019   Lab Results  Component Value Date   CHOLHDL 3.0 01/12/2019   No results found for: HGBA1C     Assessment & Plan:   Cheryl Riddle was seen today for pruritis.  Diagnoses and all orders for this visit:  Class 1 obesity due to excess calories without serious comorbidity with body mass index (BMI) of 32.0 to 32.9 in adult Increase risk for CVD, hypertension and diabetes encourage exercing and following a low sodium carbohydrate diet.  Back pain at L4-L5 level BACK PAIN  Location: L4-L5  Quality: Sharp and stabbing  Onset: gradual  Worse with: standing and bending     Better with:  Rest  Radiation: no Trauma:  no Best sitting/standing/leaning forward: no  Red Flags Fecal/urinary incontinence: NO Numbness/Weakness:No Fever/chills/sweats:No Night pain:Yes Unexplained weight loss: no No relief with bedrest:yes relief   Rash and nonspecific  skin eruption Rash around nipples and pruritis advised to purchase  lanolin OINT; Apply 1 application topically as needed. Use as directed. Wear sock or white gloves to bed she is scratching in her sleep.  Other orders -     hydrOXYzine (ATARAX/VISTARIL) 10 MG tablet; Take 1 tablet (10 mg total) by mouth 3 (three) times daily as needed. -     lanolin OINT; Apply 1 application topically as needed.    Meds ordered this encounter  Medications  . hydrOXYzine (ATARAX/VISTARIL) 10 MG tablet    Sig: Take 1 tablet (10 mg total) by mouth 3 (three) times daily as needed.    Dispense:  30 tablet    Refill:  0  . lanolin OINT    Sig: Apply 1 application topically as needed.    Dispense:  28 g    Refill:  0     Grayce Sessions, NP
